# Patient Record
Sex: Female | Born: 1965 | Race: White | Hispanic: No | Marital: Married | Smoking: Current every day smoker
Health system: Southern US, Community
[De-identification: ages and names within clinical notes are randomized; demographics above are authoritative.]

## PROBLEM LIST (undated history)

## (undated) DIAGNOSIS — I639 Cerebral infarction, unspecified: Secondary | ICD-10-CM

## (undated) DIAGNOSIS — I10 Essential (primary) hypertension: Secondary | ICD-10-CM

## (undated) DIAGNOSIS — R7303 Prediabetes: Secondary | ICD-10-CM

## (undated) DIAGNOSIS — C801 Malignant (primary) neoplasm, unspecified: Secondary | ICD-10-CM

## (undated) HISTORY — DX: Prediabetes: R73.03

## (undated) HISTORY — DX: Essential (primary) hypertension: I10

## (undated) HISTORY — DX: Cerebral infarction, unspecified: I63.9

## (undated) HISTORY — PX: TUBAL LIGATION: SHX77

## (undated) HISTORY — PX: BLADDER REPAIR: SHX76

## (undated) HISTORY — PX: ABDOMINAL HYSTERECTOMY: SHX81

## (undated) HISTORY — DX: Malignant (primary) neoplasm, unspecified: C80.1

---

## 1999-06-14 ENCOUNTER — Other Ambulatory Visit: Admission: RE | Admit: 1999-06-14 | Discharge: 1999-06-14 | Payer: Self-pay | Admitting: Orthopedic Surgery

## 1999-10-29 ENCOUNTER — Emergency Department (HOSPITAL_COMMUNITY): Admission: EM | Admit: 1999-10-29 | Discharge: 1999-10-29 | Payer: Self-pay | Admitting: Emergency Medicine

## 2000-01-27 ENCOUNTER — Emergency Department (HOSPITAL_COMMUNITY): Admission: EM | Admit: 2000-01-27 | Discharge: 2000-01-27 | Payer: Self-pay | Admitting: Emergency Medicine

## 2002-03-05 ENCOUNTER — Encounter: Payer: Self-pay | Admitting: Emergency Medicine

## 2002-03-05 ENCOUNTER — Emergency Department (HOSPITAL_COMMUNITY): Admission: EM | Admit: 2002-03-05 | Discharge: 2002-03-05 | Payer: Self-pay | Admitting: Emergency Medicine

## 2002-03-09 ENCOUNTER — Other Ambulatory Visit: Admission: RE | Admit: 2002-03-09 | Discharge: 2002-03-09 | Payer: Self-pay | Admitting: Obstetrics & Gynecology

## 2002-03-09 ENCOUNTER — Emergency Department (HOSPITAL_COMMUNITY): Admission: EM | Admit: 2002-03-09 | Discharge: 2002-03-09 | Payer: Self-pay | Admitting: Emergency Medicine

## 2002-03-24 ENCOUNTER — Emergency Department (HOSPITAL_COMMUNITY): Admission: EM | Admit: 2002-03-24 | Discharge: 2002-03-24 | Payer: Self-pay

## 2004-09-19 ENCOUNTER — Inpatient Hospital Stay (HOSPITAL_COMMUNITY): Admission: AD | Admit: 2004-09-19 | Discharge: 2004-09-19 | Payer: Self-pay | Admitting: Obstetrics

## 2004-09-20 ENCOUNTER — Inpatient Hospital Stay (HOSPITAL_COMMUNITY): Admission: AD | Admit: 2004-09-20 | Discharge: 2004-09-20 | Payer: Self-pay | Admitting: Obstetrics

## 2004-09-21 ENCOUNTER — Inpatient Hospital Stay (HOSPITAL_COMMUNITY): Admission: AD | Admit: 2004-09-21 | Discharge: 2004-09-21 | Payer: Self-pay | Admitting: Obstetrics

## 2004-10-02 ENCOUNTER — Inpatient Hospital Stay (HOSPITAL_COMMUNITY): Admission: EM | Admit: 2004-10-02 | Discharge: 2004-10-05 | Payer: Self-pay | Admitting: Emergency Medicine

## 2004-10-03 ENCOUNTER — Encounter (INDEPENDENT_AMBULATORY_CARE_PROVIDER_SITE_OTHER): Payer: Self-pay | Admitting: *Deleted

## 2004-10-04 ENCOUNTER — Encounter: Payer: Self-pay | Admitting: *Deleted

## 2006-09-15 ENCOUNTER — Inpatient Hospital Stay (HOSPITAL_COMMUNITY): Admission: AD | Admit: 2006-09-15 | Discharge: 2006-09-15 | Payer: Self-pay | Admitting: Obstetrics

## 2007-09-15 ENCOUNTER — Ambulatory Visit (HOSPITAL_COMMUNITY): Admission: RE | Admit: 2007-09-15 | Discharge: 2007-09-15 | Payer: Self-pay | Admitting: Obstetrics & Gynecology

## 2007-09-27 ENCOUNTER — Encounter: Admission: RE | Admit: 2007-09-27 | Discharge: 2007-09-27 | Payer: Self-pay | Admitting: Obstetrics & Gynecology

## 2008-06-30 ENCOUNTER — Encounter: Admission: RE | Admit: 2008-06-30 | Discharge: 2008-06-30 | Payer: Self-pay | Admitting: Obstetrics & Gynecology

## 2009-02-16 ENCOUNTER — Ambulatory Visit: Payer: Self-pay | Admitting: Family Medicine

## 2010-06-11 ENCOUNTER — Encounter: Admission: RE | Admit: 2010-06-11 | Discharge: 2010-06-11 | Payer: Self-pay | Admitting: Obstetrics & Gynecology

## 2010-09-08 ENCOUNTER — Encounter: Payer: Self-pay | Admitting: Obstetrics & Gynecology

## 2011-01-03 NOTE — Discharge Summary (Signed)
Jill Hanna, Jill Hanna                ACCOUNT NO.:  [id number]   MEDICAL RECORD NO.:  [id number]          PATIENT TYPE:  INP   LOCATION:  0366                         FACILITY:  [facility]   PHYSICIAN:  Deirdre Peer. Polite, M.D. DATE OF BIRTH:  August 05, 1966   DATE OF ADMISSION:  10/02/2004  DATE OF DISCHARGE:  10/05/2004                                 DISCHARGE SUMMARY   DISCHARGE DIAGNOSIS:  1.  Chest pain.  2.  Uncontrolled hypertension, controlled at discharge.  3.  Tobacco abuse.  4.  Anxiety.   DISCHARGE MEDICATIONS:  1.  Hydrochlorothiazide 25 mg 1 p.o. daily.  2.  Lopressor 25 mg 1 p.o. b.i.d.   DISPOSITION:  The patient is being discharged to home in stable condition.  She is asked to stop smoking, asked to follow up with primary M.D. for  further monitoring of blood pressure and treatment for anxiety.   STUDIES:  The patient had Cardiolite study which was negative for ischemia,  no wall motion abnormality, normal EF.  The patient had a CT of the head,  within normal limits.  Chest x-ray, no active disease.  MRI was ordered  however the patient refused secondary to not wanting to remove piercing from  her ear.  Carotid Doppler, no stenosis.  BMET within normal limits except  for mild hypokalemia at 3.2.  CBC within normal limits.  Cardiac enzymes  negative for ischemia.  Lipid panel:  Total cholesterol 165, HDL 43, LDL  100.  TSH 0.874.   HISTORY OF PRESENT ILLNESS:  A 45 year old female presented to the ED with  complaints of left sided chest pain and arm numbness.  Admission was deemed  necessary for further evaluation and treatment.  Please see admission H&P  for further details.   PAST MEDICAL HISTORY:  As stated above.   MEDICATIONS ON ADMISSION:  Maxide 7.5/25 daily and ibuprofen p.r.n.Marland Kitchen   ALLERGIES:  IVP DYE, PENICILLIN.   SOCIAL HISTORY:  Positive for tobacco 1/2 pack per day, occasional alcohol,  no drugs.   FAMILY HISTORY:  Mother with hypertension and  diabetes.   HOSPITAL COURSE:  The patient was admitted to a telemetry bed for evaluation  and treatment of hypertension emergency and chest pain.  The patient had  several studies as stated in data section.  At the time of my review the  patient was without chest pain, no left arm numbness.  At my review the day  before, the patient's symptoms were of more concern for angina than CVA,  although studies were ordered to rule out both.  The patient also refused  MRI as she did not want to remove her piercings.  However her carotid  Dopplers were within normal limits and CT of the head was negative.  As far  as evaluation for cardiac disease, the patient had cardiac enzymes which  were negative for ischemia.  She also had a Cardiolite, EF within normal  limits and again negative for ischemia.  The patient's  blood pressure was well controlled throughout this hospitalization on two  medications suggesting possible noncompliance on an outpatient  basis.  At  this time, the patient is medically stable for discharge, continue as  medications as outlined.      RDP/MEDQ  D:  10/05/2004  T:  10/06/2004  Job:  [id number]

## 2012-10-28 ENCOUNTER — Other Ambulatory Visit: Payer: Self-pay

## 2012-10-28 DIAGNOSIS — Z1231 Encounter for screening mammogram for malignant neoplasm of breast: Secondary | ICD-10-CM

## 2012-11-02 ENCOUNTER — Ambulatory Visit: Payer: Self-pay

## 2012-11-17 ENCOUNTER — Ambulatory Visit
Admission: RE | Admit: 2012-11-17 | Discharge: 2012-11-17 | Disposition: A | Discharge status: Home / Self Care | Payer: 59 | Source: Ambulatory Visit

## 2012-11-17 DIAGNOSIS — Z1231 Encounter for screening mammogram for malignant neoplasm of breast: Secondary | ICD-10-CM

## 2012-11-18 ENCOUNTER — Encounter: Payer: Self-pay | Admitting: Obstetrics & Gynecology

## 2012-12-13 ENCOUNTER — Ambulatory Visit: Payer: Self-pay | Admitting: Obstetrics & Gynecology

## 2012-12-15 ENCOUNTER — Ambulatory Visit: Payer: Self-pay | Admitting: Obstetrics & Gynecology

## 2013-05-20 ENCOUNTER — Encounter: Payer: Self-pay | Admitting: Advanced Practice Midwife

## 2013-05-20 ENCOUNTER — Ambulatory Visit (INDEPENDENT_AMBULATORY_CARE_PROVIDER_SITE_OTHER): Payer: 59 | Admitting: Advanced Practice Midwife

## 2013-05-20 VITALS — BP 136/78 | HR 91 | Temp 98.3°F | Ht 64.0 in | Wt 155.2 lb

## 2013-05-20 DIAGNOSIS — R1904 Left lower quadrant abdominal swelling, mass and lump: Secondary | ICD-10-CM

## 2013-05-20 DIAGNOSIS — R6889 Other general symptoms and signs: Secondary | ICD-10-CM

## 2013-05-20 DIAGNOSIS — R102 Pelvic and perineal pain: Secondary | ICD-10-CM

## 2013-05-20 DIAGNOSIS — F17200 Nicotine dependence, unspecified, uncomplicated: Secondary | ICD-10-CM

## 2013-05-20 DIAGNOSIS — IMO0002 Reserved for concepts with insufficient information to code with codable children: Secondary | ICD-10-CM

## 2013-05-20 DIAGNOSIS — R300 Dysuria: Secondary | ICD-10-CM | POA: Insufficient documentation

## 2013-05-20 DIAGNOSIS — Z01419 Encounter for gynecological examination (general) (routine) without abnormal findings: Secondary | ICD-10-CM

## 2013-05-20 LAB — POCT URINALYSIS DIPSTICK: Spec Grav, UA: 1.015

## 2013-05-20 NOTE — Progress Notes (Signed)
.   Subjective:     Jill Hanna is a 47 y.o. female here for a problem exam.  Current complaints: Patient states that she is having a lot of pelvic pain for the last 2 -3 weeks.  She also thinks she has a UTI.  Personal health questionnaire reviewed: yes.  Patient reports pain w/ intercourse and penetration. Denies vaginal dryness. Reports pain is in the pelvic abdominal region and can vary in degree of severity, occasional short stabbing pain on left side. Has had tubal ligation. Having a MP monthly, very heavy and painful, this has been her normal for some time.  Patient reports an URI.   She has HTN managed by her PCP.   Denies N/V/D.    Gynecologic History Patient's last menstrual period was 04/29/2013. Contraception: none Last Pap: 2011. Results were: normal Last mammogram: 2014. Results were: normal  Obstetric History OB History  No data available     The following portions of the patient's history were reviewed and updated as appropriate: allergies, current medications, past family history, past medical history, past social history, past surgical history and problem list.  Review of Systems A comprehensive review of systems was negative.    Objective:    BP 136/78  Pulse 91  Temp(Src) 98.3 F (36.8 C) (Oral)  Ht 5\' 4"  (1.626 m)  Wt 155 lb 3.2 oz (70.398 kg)  BMI 26.63 kg/m2  LMP 04/29/2013  General Appearance:    Alert, cooperative, no distress, appears stated age                 Physical Examination: Pelvic - VULVA: normal appearing vulva with no masses, tenderness or lesions, VAGINA: normal appearing vagina with normal color and discharge, no lesions, atrophic, CERVIX: normal appearing cervix without discharge or lesions, UTERUS: tenderness 5/10, Unable to rule out mass, fullness on left lower quadrant, ADNEXA: tenderness bilateral, unable to rule out mass  Urine dipstick shows negative for all components.  Micro exam: not done.   Assessment:   Patient  Active Problem List   Diagnosis Date Noted  . Pelvic pain 05/20/2013  . Hx of Abnormal Pap smear 05/20/2013  . Dysuria 05/20/2013  . Smoker 05/20/2013      Plan:    Patient to have pelvic US then f/u w/ MD Tamela Oddi for consultation and management.    Encouraged patient to stop smoking. Patient to f/u w/ PCP for management of URI due to high blood pressure and smoking. They may want a chest x-ray, etc.  Urine culture sent, UA negative today. Reviewed comfort measures for dysuria.  Patient was due for her pap today.  Patient up to date on her mammogram.  STI screening per patient request.  Dory Horn CNM 35 min spent with patient greater than 80% spent in counseling and coordination of care.

## 2013-05-21 LAB — URINE CULTURE: Organism ID, Bacteria: NO GROWTH

## 2013-05-23 LAB — PAP IG, CT-NG, RFX HPV ASCU

## 2013-05-25 ENCOUNTER — Other Ambulatory Visit: Payer: Self-pay | Admitting: Advanced Practice Midwife

## 2013-05-25 DIAGNOSIS — R141 Gas pain: Secondary | ICD-10-CM

## 2013-05-25 DIAGNOSIS — R1032 Left lower quadrant pain: Secondary | ICD-10-CM

## 2013-05-31 ENCOUNTER — Encounter: Payer: Self-pay | Admitting: Obstetrics & Gynecology

## 2013-05-31 ENCOUNTER — Ambulatory Visit (INDEPENDENT_AMBULATORY_CARE_PROVIDER_SITE_OTHER): Payer: 59

## 2013-05-31 DIAGNOSIS — Z01419 Encounter for gynecological examination (general) (routine) without abnormal findings: Secondary | ICD-10-CM

## 2013-05-31 DIAGNOSIS — R1904 Left lower quadrant abdominal swelling, mass and lump: Secondary | ICD-10-CM

## 2013-05-31 DIAGNOSIS — R141 Gas pain: Secondary | ICD-10-CM

## 2013-05-31 DIAGNOSIS — R102 Pelvic and perineal pain unspecified side: Secondary | ICD-10-CM

## 2013-05-31 DIAGNOSIS — R1032 Left lower quadrant pain: Secondary | ICD-10-CM

## 2013-06-09 ENCOUNTER — Ambulatory Visit: Payer: 59 | Admitting: Obstetrics & Gynecology

## 2013-07-04 ENCOUNTER — Encounter: Payer: Self-pay | Admitting: Obstetrics & Gynecology

## 2014-04-11 ENCOUNTER — Other Ambulatory Visit: Payer: Self-pay

## 2014-04-11 DIAGNOSIS — Z1231 Encounter for screening mammogram for malignant neoplasm of breast: Secondary | ICD-10-CM

## 2014-05-15 ENCOUNTER — Ambulatory Visit
Admission: RE | Admit: 2014-05-15 | Discharge: 2014-05-15 | Disposition: A | Discharge status: Home / Self Care | Payer: 59 | Source: Ambulatory Visit

## 2014-05-15 DIAGNOSIS — Z1231 Encounter for screening mammogram for malignant neoplasm of breast: Secondary | ICD-10-CM

## 2014-05-17 ENCOUNTER — Other Ambulatory Visit: Payer: Self-pay | Admitting: Obstetrics & Gynecology

## 2014-05-17 DIAGNOSIS — R928 Other abnormal and inconclusive findings on diagnostic imaging of breast: Secondary | ICD-10-CM

## 2014-05-25 ENCOUNTER — Ambulatory Visit
Admission: RE | Admit: 2014-05-25 | Discharge: 2014-05-25 | Disposition: A | Discharge status: Home / Self Care | Payer: 59 | Source: Ambulatory Visit | Attending: Obstetrics & Gynecology | Admitting: Obstetrics & Gynecology

## 2014-05-25 DIAGNOSIS — R928 Other abnormal and inconclusive findings on diagnostic imaging of breast: Secondary | ICD-10-CM

## 2014-08-03 ENCOUNTER — Encounter: Payer: Self-pay | Admitting: Obstetrics & Gynecology

## 2014-08-03 ENCOUNTER — Ambulatory Visit (INDEPENDENT_AMBULATORY_CARE_PROVIDER_SITE_OTHER): Payer: 59 | Admitting: Obstetrics & Gynecology

## 2014-08-03 ENCOUNTER — Ambulatory Visit: Payer: 59 | Admitting: Obstetrics & Gynecology

## 2014-08-03 VITALS — BP 167/102 | HR 91 | Temp 96.8°F | Ht 64.0 in | Wt 152.0 lb

## 2014-08-03 DIAGNOSIS — Z01419 Encounter for gynecological examination (general) (routine) without abnormal findings: Secondary | ICD-10-CM

## 2014-08-03 DIAGNOSIS — J069 Acute upper respiratory infection, unspecified: Secondary | ICD-10-CM

## 2014-08-03 LAB — CBC WITH DIFFERENTIAL/PLATELET
BASOS ABS: 0.1 10*3/uL (ref 0.0–0.1)
BASOS PCT: 1 % (ref 0–1)
Eosinophils Absolute: 0.2 10*3/uL (ref 0.0–0.7)
Eosinophils Relative: 2 % (ref 0–5)
HCT: 38.1 % (ref 36.0–46.0)
Hemoglobin: 12.1 g/dL (ref 12.0–15.0)
LYMPHS PCT: 21 % (ref 12–46)
Lymphs Abs: 2 10*3/uL (ref 0.7–4.0)
MCH: 24.6 pg — ABNORMAL LOW (ref 26.0–34.0)
MCHC: 31.8 g/dL (ref 30.0–36.0)
MCV: 77.4 fL — AB (ref 78.0–100.0)
MONO ABS: 0.8 10*3/uL (ref 0.1–1.0)
MPV: 9.1 fL — AB (ref 9.4–12.4)
Monocytes Relative: 8 % (ref 3–12)
NEUTROS ABS: 6.6 10*3/uL (ref 1.7–7.7)
NEUTROS PCT: 68 % (ref 43–77)
PLATELETS: 347 10*3/uL (ref 150–400)
RBC: 4.92 MIL/uL (ref 3.87–5.11)
RDW: 16.7 % — AB (ref 11.5–15.5)
WBC: 9.7 10*3/uL (ref 4.0–10.5)

## 2014-08-03 MED ORDER — AZITHROMYCIN 250 MG PO TABS: ORAL_TABLET | ORAL | Status: DC
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
## 2014-08-03 NOTE — Patient Instructions (Signed)

## 2014-08-04 LAB — TSH: TSH: 1.121 u[IU]/mL (ref 0.350–4.500)

## 2014-08-04 LAB — PAP IG (IMAGE GUIDED)

## 2014-08-14 ENCOUNTER — Encounter: Payer: Self-pay | Admitting: *Deleted

## 2014-08-15 ENCOUNTER — Telehealth: Payer: Self-pay | Admitting: *Deleted

## 2014-08-15 ENCOUNTER — Encounter: Payer: Self-pay | Admitting: Obstetrics & Gynecology

## 2014-08-15 NOTE — Telephone Encounter (Signed)
Pt called to office requesting lab results.   Return call to pt.  Made pt aware that all labs were WNL.  Pt asked about what could be causing her hair loss.  Pt would like to know if we could possible check her estrogen level or any other hormones that may cause this.  Pt states that she may also see a dermatologist for this as well. Pt made aware that lab request would be forwarded to Dr Delsa Sale for recommendation.   Please advise on any additional labs for hair loss.

## 2014-08-21 NOTE — Telephone Encounter (Signed)
If you are able to get a hold of Shaconda please tell her about [facility] [place], there is a Solicitor and [place] location and they should be able to help her.

## 2014-08-28 NOTE — Telephone Encounter (Signed)
I think the pt has seen a dermatologist.  Doubt it is related to ERT

## 2014-09-01 NOTE — Telephone Encounter (Signed)
Placed call to follow up with pt regarding hair loss.  Pt made aware of recommendation for dermatologist.  Pt states that she has not been able to make appt due to cost.  Pt advised to contact office if she has any further concerns.

## 2014-09-29 ENCOUNTER — Encounter: Payer: Self-pay | Admitting: Obstetrics

## 2014-09-29 ENCOUNTER — Ambulatory Visit (INDEPENDENT_AMBULATORY_CARE_PROVIDER_SITE_OTHER): Payer: 59 | Admitting: Obstetrics

## 2014-09-29 VITALS — BP 153/89 | HR 81 | Temp 97.7°F | Wt 156.0 lb

## 2014-09-29 DIAGNOSIS — D251 Intramural leiomyoma of uterus: Secondary | ICD-10-CM

## 2014-09-29 DIAGNOSIS — N946 Dysmenorrhea, unspecified: Secondary | ICD-10-CM

## 2014-09-29 DIAGNOSIS — N939 Abnormal uterine and vaginal bleeding, unspecified: Secondary | ICD-10-CM

## 2014-09-29 LAB — CBC WITH DIFFERENTIAL/PLATELET
BASOS ABS: 0 10*3/uL (ref 0.0–0.1)
BASOS PCT: 0 % (ref 0–1)
EOS ABS: 0.2 10*3/uL (ref 0.0–0.7)
Eosinophils Relative: 3 % (ref 0–5)
HEMATOCRIT: 36 % (ref 36.0–46.0)
Hemoglobin: 11.4 g/dL — ABNORMAL LOW (ref 12.0–15.0)
Lymphocytes Relative: 25 % (ref 12–46)
Lymphs Abs: 1.7 10*3/uL (ref 0.7–4.0)
MCH: 24.9 pg — ABNORMAL LOW (ref 26.0–34.0)
MCHC: 31.7 g/dL (ref 30.0–36.0)
MCV: 78.8 fL (ref 78.0–100.0)
MONOS PCT: 10 % (ref 3–12)
MPV: 9.4 fL (ref 8.6–12.4)
Monocytes Absolute: 0.7 10*3/uL (ref 0.1–1.0)
NEUTROS ABS: 4.2 10*3/uL (ref 1.7–7.7)
NEUTROS PCT: 62 % (ref 43–77)
Platelets: 276 10*3/uL (ref 150–400)
RBC: 4.57 MIL/uL (ref 3.87–5.11)
RDW: 19.2 % — AB (ref 11.5–15.5)
WBC: 6.7 10*3/uL (ref 4.0–10.5)

## 2014-09-29 LAB — POCT URINALYSIS DIPSTICK
Bilirubin, UA: NEGATIVE
Blood, UA: 250
GLUCOSE UA: NEGATIVE
KETONES UA: NEGATIVE
LEUKOCYTES UA: NEGATIVE
Nitrite, UA: NEGATIVE
PROTEIN UA: NEGATIVE
SPEC GRAV UA: 1.02
UROBILINOGEN UA: NEGATIVE
pH, UA: 5

## 2014-09-29 MED ORDER — IBUPROFEN 800 MG PO TABS: 800.0000 mg | ORAL_TABLET | Freq: Three times a day (TID) | ORAL | Status: DC | PRN

## 2014-09-29 MED ORDER — OXYCODONE HCL 10 MG PO TABS: 10.0000 mg | ORAL_TABLET | Freq: Four times a day (QID) | ORAL | Status: DC | PRN

## 2014-09-29 NOTE — Addendum Note (Signed)
Addended by: Lewie Loron D on: 09/29/2014 04:00 PM   Modules accepted: Orders
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
## 2014-10-04 ENCOUNTER — Other Ambulatory Visit: Payer: 59

## 2014-10-04 ENCOUNTER — Ambulatory Visit (INDEPENDENT_AMBULATORY_CARE_PROVIDER_SITE_OTHER): Payer: 59

## 2014-10-04 ENCOUNTER — Other Ambulatory Visit: Payer: Self-pay | Admitting: Obstetrics

## 2014-10-04 DIAGNOSIS — N939 Abnormal uterine and vaginal bleeding, unspecified: Secondary | ICD-10-CM

## 2014-10-04 DIAGNOSIS — N946 Dysmenorrhea, unspecified: Secondary | ICD-10-CM

## 2014-10-04 DIAGNOSIS — D251 Intramural leiomyoma of uterus: Secondary | ICD-10-CM

## 2014-10-10 ENCOUNTER — Other Ambulatory Visit: Payer: 59

## 2014-10-18 ENCOUNTER — Ambulatory Visit: Payer: 59 | Admitting: Certified Nurse Midwife

## 2015-02-02 ENCOUNTER — Emergency Department (HOSPITAL_COMMUNITY)
Admission: EM | Admit: 2015-02-02 | Discharge: 2015-02-02 | Disposition: A | Discharge status: Nursing Home (Includes ICF) | Payer: Managed Care, Other (non HMO) | Source: Home / Self Care | Attending: Family Medicine | Admitting: Family Medicine

## 2015-02-02 ENCOUNTER — Encounter (HOSPITAL_COMMUNITY): Payer: Self-pay | Admitting: Emergency Medicine

## 2015-02-02 ENCOUNTER — Emergency Department (HOSPITAL_COMMUNITY)
Admission: EM | Admit: 2015-02-02 | Discharge: 2015-02-03 | Disposition: A | Discharge status: Home / Self Care | Payer: Managed Care, Other (non HMO) | Attending: Emergency Medicine | Admitting: Emergency Medicine

## 2015-02-02 ENCOUNTER — Encounter (HOSPITAL_COMMUNITY): Payer: Self-pay | Admitting: *Deleted

## 2015-02-02 ENCOUNTER — Emergency Department (HOSPITAL_COMMUNITY): Payer: Managed Care, Other (non HMO)

## 2015-02-02 DIAGNOSIS — Y998 Other external cause status: Secondary | ICD-10-CM | POA: Insufficient documentation

## 2015-02-02 DIAGNOSIS — S81012A Laceration without foreign body, left knee, initial encounter: Secondary | ICD-10-CM

## 2015-02-02 DIAGNOSIS — S99911A Unspecified injury of right ankle, initial encounter: Secondary | ICD-10-CM | POA: Diagnosis not present

## 2015-02-02 DIAGNOSIS — I10 Essential (primary) hypertension: Secondary | ICD-10-CM | POA: Diagnosis not present

## 2015-02-02 DIAGNOSIS — Z880 Allergy status to penicillin: Secondary | ICD-10-CM | POA: Diagnosis not present

## 2015-02-02 DIAGNOSIS — Z720 Tobacco use: Secondary | ICD-10-CM | POA: Insufficient documentation

## 2015-02-02 DIAGNOSIS — W01198A Fall on same level from slipping, tripping and stumbling with subsequent striking against other object, initial encounter: Secondary | ICD-10-CM | POA: Insufficient documentation

## 2015-02-02 DIAGNOSIS — Y92009 Unspecified place in unspecified non-institutional (private) residence as the place of occurrence of the external cause: Secondary | ICD-10-CM | POA: Diagnosis not present

## 2015-02-02 DIAGNOSIS — Z79899 Other long term (current) drug therapy: Secondary | ICD-10-CM | POA: Insufficient documentation

## 2015-02-02 DIAGNOSIS — Y9389 Activity, other specified: Secondary | ICD-10-CM | POA: Diagnosis not present

## 2015-02-02 DIAGNOSIS — IMO0002 Reserved for concepts with insufficient information to code with codable children: Secondary | ICD-10-CM

## 2015-02-02 MED ORDER — CEPHALEXIN 500 MG PO CAPS: 500.0000 mg | ORAL_CAPSULE | Freq: Four times a day (QID) | ORAL | Status: DC

## 2015-02-02 MED ORDER — LIDOCAINE-EPINEPHRINE (PF) 2 %-1:200000 IJ SOLN
10.0000 mL | Freq: Once | INTRAMUSCULAR | Status: AC
  Administered 2015-02-02: 10 mL
  Filled 2015-02-02: qty 20

## 2015-02-02 MED ORDER — TETANUS-DIPHTH-ACELL PERTUSSIS 5-2.5-18.5 LF-MCG/0.5 IM SUSP
INTRAMUSCULAR | Status: AC
  Filled 2015-02-02: qty 0.5

## 2015-02-02 MED ORDER — BACITRACIN ZINC 500 UNIT/GM EX OINT
1.0000 "application " | TOPICAL_OINTMENT | Freq: Two times a day (BID) | CUTANEOUS | Status: DC
  Administered 2015-02-03: 1 via TOPICAL
  Filled 2015-02-02: qty 28.35
  Filled 2015-02-02: qty 0.9

## 2015-02-02 MED ORDER — OXYCODONE-ACETAMINOPHEN 5-325 MG PO TABS
1.0000 | ORAL_TABLET | Freq: Once | ORAL | Status: AC
  Administered 2015-02-02: 1 via ORAL
  Filled 2015-02-02: qty 1

## 2015-02-02 MED ORDER — TETANUS-DIPHTH-ACELL PERTUSSIS 5-2.5-18.5 LF-MCG/0.5 IM SUSP
0.5000 mL | Freq: Once | INTRAMUSCULAR | Status: AC
  Administered 2015-02-02: 0.5 mL via INTRAMUSCULAR

## 2015-02-02 NOTE — ED Provider Notes (Signed)
CSN: [id number]     Arrival date & time 02/02/15  2040 History   This chart was scribed for Comer Locket, PA-C working with Elnora Morrison, MD by Mercy Moore, ED Scribe. This patient was seen in room TR01C/TR01C and the patient's care was started at 9:56 PM.   Chief Complaint  Patient presents with  . Laceration  . Fall   The history is provided by the patient.   HPI Comments: Jill Hanna is a 49 y.o. female with PMHx of Hypertension who presents to the Emergency Department as transfer from [facility] for evaluation of left knee laceration incurred just 24 hours ago. Patient sustained deep laceration to anterior patella and abrasions to lower leg after tripping over pet cat and striking knee on concrete step. Patient denies head injury or loss of consciousness.  Patient reports that she attempted to contact orthopedist, but the office is closed today. Patient reports treatment with Neosporin and bandaging. Patient reports severe pain and swelling left knee and right ankle swelling. Patient reports decreased flexion and extension due to pain severity. Patient with history of Hypertension; not on anticoagulants.   Past Medical History  Diagnosis Date  . Hypertension    Past Surgical History  Procedure Laterality Date  . Tubal ligation     Family History  Problem Relation Age of Onset  . Cancer Maternal Grandmother   . Cancer Mother    History  Substance Use Topics  . Smoking status: Current Every Day Smoker -- 0.00 packs/day for 0 years  . Smokeless tobacco: Never Used  . Alcohol Use: Yes   OB History    Gravida Para Term Preterm AB TAB SAB Ectopic Multiple Living   2 2 1 1  0 0 0 0 0 3     Review of Systems  Constitutional: Negative for fever and chills.  HENT: Negative for congestion, rhinorrhea and sore throat.   Eyes: Negative for visual disturbance.  Respiratory: Negative for cough, shortness of breath and wheezing.   Gastrointestinal: Negative for nausea,  vomiting, abdominal pain and diarrhea.  Endocrine: Negative for polyuria.  Genitourinary: Negative for dysuria and hematuria.  Musculoskeletal: Positive for arthralgias. Negative for back pain and joint swelling.  Skin: Positive for wound. Negative for rash.  Allergic/Immunologic: Negative for immunocompromised state.  Neurological: Negative for syncope and headaches.  Hematological: Does not bruise/bleed easily.  Psychiatric/Behavioral: Negative for confusion.  All other systems reviewed and are negative.     Allergies  Penicillins  Home Medications   Prior to Admission medications   Medication Sig Start Date End Date Taking? Authorizing Provider  amLODipine-valsartan (EXFORGE) 5-160 MG per tablet Take 1 tablet by mouth daily.    Historical Provider, MD  azithromycin (ZITHROMAX Z-PAK) 250 MG tablet Take as directed. Patient not taking: Reported on 09/29/2014 08/03/14   Lahoma Crocker, MD  cephALEXin (KEFLEX) 500 MG capsule Take 1 capsule (500 mg total) by mouth 4 (four) times daily. 02/02/15   Comer Locket, PA-C  ibuprofen (ADVIL,MOTRIN) 800 MG tablet Take 1 tablet (800 mg total) by mouth every 8 (eight) hours as needed. 09/29/14   Shelly Bombard, MD  Oxycodone HCl 10 MG TABS Take 1 tablet (10 mg total) by mouth every 6 (six) hours as needed. 09/29/14   Shelly Bombard, MD   Triage Vitals: BP 158/80 mmHg  Pulse 88  Temp(Src) 98.2 F (36.8 C) (Oral)  Resp 18  SpO2 99%  LMP 01/17/2015 Physical Exam  Constitutional: She is oriented to  person, place, and time. She appears well-developed and well-nourished. No distress.  HENT:  Head: Normocephalic and atraumatic.  Eyes: EOM are normal.  Neck: Neck supple. No tracheal deviation present.  Cardiovascular: Normal rate and regular rhythm.   Pulmonary/Chest: Effort normal. No respiratory distress.  Abdominal: Soft. There is no tenderness.  Musculoskeletal: Normal range of motion.  Left knee: approximately 4cm transverse  laceration noted to lateral aspect of knee. Approximately .5 cm deep in the middle of laceration. Base of the wound is visualized and there does not appear to be any joint space involvement. Hemostasis achieved.  ROM of left knee limited secondary to pain.  Full ROM of left ankle. Oblique abrasion noted to proxmial tibia next to patella tendon.  Distal pulses intact. NVI.   Neurological: She is alert and oriented to person, place, and time.  Skin: Skin is warm and dry.  Psychiatric: She has a normal mood and affect. Her behavior is normal.  Nursing note and vitals reviewed.   ED Course  Procedures (including critical care time)  COORDINATION OF CARE: 10:12 PM- Discussed treatment plan with patient at bedside and patient agreed to plan.   Labs Review Labs Reviewed - No data to display  Imaging Review Dg Knee Complete 4 Views Left  02/02/2015   CLINICAL DATA:  Laceration to the anterior left knee and pain after a fall last night on a wooden deck.  EXAM: LEFT KNEE - COMPLETE 4+ VIEW  COMPARISON:  None.  FINDINGS: There is no evidence of fracture, dislocation, or joint effusion. There is no evidence of arthropathy or other focal bone abnormality. Soft tissues are unremarkable.  IMPRESSION: Negative.   Electronically Signed   By: Lucienne Capers M.D.   On: 02/02/2015 21:26     EKG Interpretation None     Meds given in ED:  Medications  bacitracin ointment 1 application (not administered)  lidocaine-EPINEPHrine (XYLOCAINE W/EPI) 2 %-1:200000 (PF) injection 10 mL (10 mLs Infiltration Given 02/02/15 2300)  oxyCODONE-acetaminophen (PERCOCET/ROXICET) 5-325 MG per tablet 1 tablet (1 tablet Oral Given 02/02/15 2300)    New Prescriptions   CEPHALEXIN (KEFLEX) 500 MG CAPSULE    Take 1 capsule (500 mg total) by mouth 4 (four) times daily.   Filed Vitals:   02/02/15 2103  BP: 158/80  Pulse: 88  Temp: 98.2 F (36.8 C)  TempSrc: Oral  Resp: 18  SpO2: 99%   LACERATION REPAIR Performed  by: Verl Dicker Authorized by: Verl Dicker Consent: Verbal consent obtained. Risks and benefits: risks, benefits and alternatives were discussed Consent given by: patient Patient identity confirmed: provided demographic data Prepped and Draped in normal sterile fashion Wound explored  Laceration Location: left knee  Laceration Length 4cm  No Foreign Bodies seen or palpated  Anesthesia: local infiltration  Local anesthetic: lidocaine2 % without epinephrine  Anesthetic total: 5 ml  Irrigation method: syringe Amount of cleaning: standard  Skin closure: 3-0 prolene Loose closure  Number of sutures: 3  Technique: Simple int. Patient tolerance: Patient tolerated the procedure well with no immediate complications. MDM  Vitals stable - WNL -afebrile Pt resting comfortably in ED. PE--laceration over left patella no evidence of joint space involvement. Patient remains neurovascularly intact Imaging--plain films of left knee negative for acute osseous abnormalities.  DDX-- laceration repaired by myself at bedside. Topical antibiotic and dressing applied. Patient given oral antibiotic and instructions to follow-up with her primary care in 2 days. Patient agrees to plan.  I discussed all relevant lab findings  and imaging results with pt and they verbalized understanding. Discussed f/u with PCP within 48 hrs and return precautions, pt very amenable to plan. Prior to patient discharge, I discussed and reviewed this case with Dr. Reather Converse, who also saw and evaluated the patient   Final diagnoses:  Laceration    I personally performed the services described in this documentation, which was scribed in my presence. The recorded information has been reviewed and is accurate.    Comer Locket, PA-C 02/02/15 [id number]  Elnora Morrison, MD 02/03/15 [phone]

## 2015-02-02 NOTE — ED Provider Notes (Signed)
CSN: [id number]     Arrival date & time 02/02/15  1848 History   First MD Initiated Contact with Patient 02/02/15 1959     Chief Complaint  Patient presents with  . Fall   (Consider location/radiation/quality/duration/timing/severity/associated sxs/prior Treatment) Patient is a 49 y.o. female presenting with fall. The history is provided by the patient.  Fall This is a new problem. The current episode started 12 to 24 hours ago (tripped over pet last eve on brick steps stiking left knee, sustaining lac and abrasions and pain). The problem has been gradually worsening. Pertinent negatives include no chest pain, no abdominal pain and no shortness of breath.    Past Medical History  Diagnosis Date  . Hypertension    Past Surgical History  Procedure Laterality Date  . Tubal ligation     Family History  Problem Relation Age of Onset  . Cancer Maternal Grandmother   . Cancer Mother    History  Substance Use Topics  . Smoking status: Current Every Day Smoker -- 0.50 packs/day for 25 years  . Smokeless tobacco: Never Used  . Alcohol Use: 1.8 - 2.4 oz/week    3-4 Shots of liquor per week     Comment: Occ.   OB History    Gravida Para Term Preterm AB TAB SAB Ectopic Multiple Living   2 2 1 1  0 0 0 0 0 3     Review of Systems  Constitutional: Positive for activity change.  Respiratory: Negative for shortness of breath.   Cardiovascular: Negative for chest pain.  Gastrointestinal: Negative.  Negative for abdominal pain.  Musculoskeletal: Positive for joint swelling and gait problem. Negative for back pain.  Skin: Positive for wound.    Allergies  Penicillins  Home Medications   Prior to Admission medications   Medication Sig Start Date End Date Taking? Authorizing Provider  amLODipine-valsartan (EXFORGE) 5-160 MG per tablet Take 1 tablet by mouth daily.    Historical Provider, MD  azithromycin (ZITHROMAX Z-PAK) 250 MG tablet Take as directed. Patient not taking: Reported  on 09/29/2014 08/03/14   Lahoma Crocker, MD  ibuprofen (ADVIL,MOTRIN) 800 MG tablet Take 1 tablet (800 mg total) by mouth every 8 (eight) hours as needed. 09/29/14   Shelly Bombard, MD  Oxycodone HCl 10 MG TABS Take 1 tablet (10 mg total) by mouth every 6 (six) hours as needed. 09/29/14   Shelly Bombard, MD   BP 168/86 mmHg  Pulse 80  Temp(Src) 98.4 F (36.9 C) (Oral)  Resp 16  SpO2 98%  LMP 01/17/2015 Physical Exam  Constitutional: She is oriented to person, place, and time. She appears well-developed and well-nourished. She appears distressed.  Abdominal: Soft. Bowel sounds are normal.  Musculoskeletal: She exhibits tenderness.       Left knee: She exhibits decreased range of motion, swelling, effusion, laceration and bony tenderness.       Legs: Neurological: She is alert and oriented to person, place, and time.  Skin: Skin is warm and dry. Rash noted.  Left lower leg abrasions with assoc deep 3cm lac over patella  Nursing note and vitals reviewed.   ED Course  Procedures (including critical care time) Labs Review Labs Reviewed - No data to display  Imaging Review No results found.   MDM   1. Knee laceration, left, initial encounter        Billy Fischer, MD 02/02/15 2006

## 2015-02-02 NOTE — Discharge Instructions (Signed)
Follow-up with your primary care in 2 days for reevaluation. Take your antibiotic as prescribed. You may take showers, but avoid taking baths. Return to ED for new or worsening symptoms.  Laceration Care, Adult A laceration is a cut or lesion that goes through all layers of the skin and into the tissue just beneath the skin. TREATMENT  Some lacerations may not require closure. Some lacerations may not be able to be closed due to an increased risk of infection. It is important to see your caregiver as soon as possible after an injury to minimize the risk of infection and maximize the opportunity for successful closure. If closure is appropriate, pain medicines may be given, if needed. The wound will be cleaned to help prevent infection. Your caregiver will use stitches (sutures), staples, wound glue (adhesive), or skin adhesive strips to repair the laceration. These tools bring the skin edges together to allow for faster healing and a better cosmetic outcome. However, all wounds will heal with a scar. Once the wound has healed, scarring can be minimized by covering the wound with sunscreen during the day for 1 full year. HOME CARE INSTRUCTIONS  For sutures or staples:  Keep the wound clean and dry.  If you were given a bandage (dressing), you should change it at least once a day. Also, change the dressing if it becomes wet or dirty, or as directed by your caregiver.  Wash the wound with soap and water 2 times a day. Rinse the wound off with water to remove all soap. Pat the wound dry with a clean towel.  After cleaning, apply a thin layer of the antibiotic ointment as recommended by your caregiver. This will help prevent infection and keep the dressing from sticking.  You may shower as usual after the first 24 hours. Do not soak the wound in water until the sutures are removed.  Only take over-the-counter or prescription medicines for pain, discomfort, or fever as directed by your  caregiver.  Get your sutures or staples removed as directed by your caregiver. For skin adhesive strips:  Keep the wound clean and dry.  Do not get the skin adhesive strips wet. You may bathe carefully, using caution to keep the wound dry.  If the wound gets wet, pat it dry with a clean towel.  Skin adhesive strips will fall off on their own. You may trim the strips as the wound heals. Do not remove skin adhesive strips that are still stuck to the wound. They will fall off in time. For wound adhesive:  You may briefly wet your wound in the shower or bath. Do not soak or scrub the wound. Do not swim. Avoid periods of heavy perspiration until the skin adhesive has fallen off on its own. After showering or bathing, gently pat the wound dry with a clean towel.  Do not apply liquid medicine, cream medicine, or ointment medicine to your wound while the skin adhesive is in place. This may loosen the film before your wound is healed.  If a dressing is placed over the wound, be careful not to apply tape directly over the skin adhesive. This may cause the adhesive to be pulled off before the wound is healed.  Avoid prolonged exposure to sunlight or tanning lamps while the skin adhesive is in place. Exposure to ultraviolet light in the first year will darken the scar.  The skin adhesive will usually remain in place for 5 to 10 days, then naturally fall off the skin.  Do not pick at the adhesive film. You may need a tetanus shot if:  You cannot remember when you had your last tetanus shot.  You have never had a tetanus shot. If you get a tetanus shot, your arm may swell, get red, and feel warm to the touch. This is common and not a problem. If you need a tetanus shot and you choose not to have one, there is a rare chance of getting tetanus. Sickness from tetanus can be serious. SEEK MEDICAL CARE IF:   You have redness, swelling, or increasing pain in the wound.  You see a red line that goes away  from the wound.  You have yellowish-white fluid (pus) coming from the wound.  You have a fever.  You notice a bad smell coming from the wound or dressing.  Your wound breaks open before or after sutures have been removed.  You notice something coming out of the wound such as wood or glass.  Your wound is on your hand or foot and you cannot move a finger or toe. SEEK IMMEDIATE MEDICAL CARE IF:   Your pain is not controlled with prescribed medicine.  You have severe swelling around the wound causing pain and numbness or a change in color in your arm, hand, leg, or foot.  Your wound splits open and starts bleeding.  You have worsening numbness, weakness, or loss of function of any joint around or beyond the wound.  You develop painful lumps near the wound or on the skin anywhere on your body. MAKE SURE YOU:   Understand these instructions.  Will watch your condition.  Will get help right away if you are not doing well or get worse. Document Released: 08/04/2005 Document Revised: 10/27/2011 Document Reviewed: 01/28/2011 [facility] Patient Information 2015 [place], [place]. This information is not intended to replace advice given to you by your health care provider. Make sure you discuss any questions you have with your health care provider.

## 2015-02-02 NOTE — ED Notes (Signed)
Fell  Last  Pm      inj  To  l  Knee   And  l   Leg        Swelling  And  Deep  Laceration   Present           -  Bleeding  Has  Subsided        Pt    Is  Awake  And  Alert     Unknown  As   To   Last  Tetanus  Shot

## 2015-02-02 NOTE — ED Notes (Signed)
Pt. tripped and fell at home last night , no LOC /ambulatory , seen at urgent care this evening sent here for evaluation of left knee laceration .

## 2015-02-03 MED ORDER — CEPHALEXIN 500 MG PO CAPS: 500.0000 mg | ORAL_CAPSULE | Freq: Four times a day (QID) | ORAL | Status: DC

## 2015-07-25 ENCOUNTER — Other Ambulatory Visit: Payer: Self-pay

## 2015-07-25 DIAGNOSIS — Z1231 Encounter for screening mammogram for malignant neoplasm of breast: Secondary | ICD-10-CM

## 2015-08-09 ENCOUNTER — Ambulatory Visit: Payer: Managed Care, Other (non HMO)

## 2015-08-24 ENCOUNTER — Ambulatory Visit
Admission: RE | Admit: 2015-08-24 | Discharge: 2015-08-24 | Disposition: A | Discharge status: Home / Self Care | Payer: Managed Care, Other (non HMO) | Source: Ambulatory Visit

## 2015-08-24 DIAGNOSIS — Z1231 Encounter for screening mammogram for malignant neoplasm of breast: Secondary | ICD-10-CM

## 2015-08-27 ENCOUNTER — Other Ambulatory Visit: Payer: Self-pay | Admitting: Obstetrics & Gynecology

## 2015-08-27 DIAGNOSIS — R928 Other abnormal and inconclusive findings on diagnostic imaging of breast: Secondary | ICD-10-CM

## 2015-08-29 ENCOUNTER — Ambulatory Visit (INDEPENDENT_AMBULATORY_CARE_PROVIDER_SITE_OTHER): Payer: Managed Care, Other (non HMO) | Admitting: Certified Nurse Midwife

## 2015-08-29 ENCOUNTER — Telehealth: Payer: Self-pay

## 2015-08-29 ENCOUNTER — Encounter: Payer: Self-pay | Admitting: Certified Nurse Midwife

## 2015-08-29 VITALS — BP 152/87 | HR 84 | Wt 163.0 lb

## 2015-08-29 DIAGNOSIS — F329 Major depressive disorder, single episode, unspecified: Secondary | ICD-10-CM

## 2015-08-29 DIAGNOSIS — N939 Abnormal uterine and vaginal bleeding, unspecified: Secondary | ICD-10-CM

## 2015-08-29 DIAGNOSIS — Z716 Tobacco abuse counseling: Secondary | ICD-10-CM

## 2015-08-29 DIAGNOSIS — Z01419 Encounter for gynecological examination (general) (routine) without abnormal findings: Secondary | ICD-10-CM

## 2015-08-29 DIAGNOSIS — F32A Depression, unspecified: Secondary | ICD-10-CM

## 2015-08-29 DIAGNOSIS — T7491XA Unspecified adult maltreatment, confirmed, initial encounter: Secondary | ICD-10-CM

## 2015-08-29 DIAGNOSIS — D259 Leiomyoma of uterus, unspecified: Secondary | ICD-10-CM | POA: Insufficient documentation

## 2015-08-29 MED ORDER — BUPROPION HCL ER (XL) 300 MG PO TB24
300.0000 mg | ORAL_TABLET | Freq: Every day | ORAL | Status: DC
Start: 2015-08-29 — End: 2017-09-01
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
## 2015-08-29 NOTE — Telephone Encounter (Signed)
ATIENT DOES NOT KNOW HER WORK [place] YET AND PREFERRED TO MAKE HER OWN APPT - WE GOT HER SET-UP IN DR. CATHERINE MATTHEWS' SYSTEM AND SENT HER RECORDS TO THEM, SO WHEN SHE CALLS, THEY HAVE EVERYTHING  ALSO, SHE TOOK CARD FOR JOURNEY'S - WILL [place] WHEN SHE KNOWS HER DAYS OFF

## 2015-08-31 LAB — PAP, TP IMAGING W/ HPV RNA, RFLX HPV TYPE 16,18/45: HPV MRNA, HIGH RISK: NOT DETECTED

## 2015-09-02 LAB — SURESWAB BACTERIAL VAGINOSIS/ITIS
Atopobium vaginae: NOT DETECTED Log (cells/mL)
C. albicans, DNA: NOT DETECTED
C. glabrata, DNA: NOT DETECTED
C. parapsilosis, DNA: NOT DETECTED
C. tropicalis, DNA: NOT DETECTED
GARDNERELLA VAGINALIS: NOT DETECTED Log (cells/mL)
LACTOBACILLUS SPECIES: 5.8 Log (cells/mL)
MEGASPHAERA SPECIES: NOT DETECTED Log (cells/mL)
T. vaginalis RNA, QL TMA: NOT DETECTED

## 2015-09-04 ENCOUNTER — Other Ambulatory Visit: Payer: Self-pay | Admitting: Certified Nurse Midwife

## 2015-09-04 ENCOUNTER — Ambulatory Visit (INDEPENDENT_AMBULATORY_CARE_PROVIDER_SITE_OTHER): Payer: Managed Care, Other (non HMO) | Admitting: *Deleted

## 2015-09-04 ENCOUNTER — Telehealth: Payer: Self-pay | Admitting: *Deleted

## 2015-09-04 VITALS — BP 132/83 | HR 83 | Temp 97.9°F | Wt 158.0 lb

## 2015-09-04 DIAGNOSIS — N939 Abnormal uterine and vaginal bleeding, unspecified: Secondary | ICD-10-CM

## 2015-09-04 MED ORDER — MEDROXYPROGESTERONE ACETATE 150 MG/ML IM SUSP: 150.0000 mg | INTRAMUSCULAR | Status: DC

## 2015-09-04 MED ORDER — MEDROXYPROGESTERONE ACETATE 150 MG/ML IM SUSP
150.0000 mg | INTRAMUSCULAR | Status: AC
  Administered 2015-09-04: 150 mg via INTRAMUSCULAR

## 2015-09-04 NOTE — Progress Notes (Signed)
Patient in office today for a Depo injection. Patient has an appointment with Dr. Zigmund Daniel tomorrow for a consult to address her chronic bleeding and pain. Patient encourage to keep her appointment. Patient tolerated injection well.  BP 132/83 mmHg  Pulse 83  Temp(Src) 97.9 F (36.6 C)  Wt 158 lb (71.668 kg)  LMP 08/19/2015

## 2015-09-04 NOTE — Telephone Encounter (Signed)
Spoke with patient.  Patient is wanting depo injection to stop her bleeding.  She has an appointment tomorrow morning with Dr. Zigmund Daniel.  She states that her bleeding is worse.  Will come by today for Depo injection at lunch time.  R.Jaquay Posthumus CNM

## 2015-09-04 NOTE — Telephone Encounter (Signed)
Patient was seen last week and referred for surgical consult. She has been bleeding and would like a shot to stop that until she can be seen by the consulting doctor about a more permanent solution. Call forwarded to [facility] for evaluation and response to request- high dose Depo is given at [facility].

## 2015-09-07 ENCOUNTER — Ambulatory Visit
Admission: RE | Admit: 2015-09-07 | Discharge: 2015-09-07 | Disposition: A | Discharge status: Home / Self Care | Payer: Managed Care, Other (non HMO) | Source: Ambulatory Visit | Attending: Obstetrics & Gynecology | Admitting: Obstetrics & Gynecology

## 2015-09-07 DIAGNOSIS — R928 Other abnormal and inconclusive findings on diagnostic imaging of breast: Secondary | ICD-10-CM

## 2015-09-10 ENCOUNTER — Other Ambulatory Visit: Payer: Self-pay | Admitting: Obstetrics

## 2015-09-10 ENCOUNTER — Telehealth: Payer: Self-pay | Admitting: *Deleted

## 2015-09-10 DIAGNOSIS — N939 Abnormal uterine and vaginal bleeding, unspecified: Secondary | ICD-10-CM

## 2015-09-10 MED ORDER — MEGESTROL ACETATE 40 MG PO TABS: ORAL_TABLET | ORAL | Status: DC

## 2015-09-10 NOTE — Telephone Encounter (Signed)
Patient is scheduled for surgery- but she is still bleeding and doesn't know if she can wait until March. She had a Depo shot last week and is having on/off bleeding with clots and pain. She is on her feet 8-10 hours/day. She saw Dr Zigmund Daniel and has [place] and Endometrial bx. She is scheduled for surgery 3/2. She called them and they told her we would have to help her with the bleeding.  After reviewing everything with Dr Jodi Mourning- he is sending in Megace Rx for her- call her and left message on her cell- with instructions.

## 2015-10-11 ENCOUNTER — Other Ambulatory Visit: Payer: Self-pay | Admitting: Obstetrics

## 2016-03-03 ENCOUNTER — Encounter (HOSPITAL_COMMUNITY): Payer: Self-pay

## 2016-03-03 ENCOUNTER — Emergency Department (HOSPITAL_COMMUNITY)
Admission: EM | Admit: 2016-03-03 | Discharge: 2016-03-03 | Disposition: A | Discharge status: Home / Self Care | Payer: Managed Care, Other (non HMO) | Attending: Emergency Medicine | Admitting: Emergency Medicine

## 2016-03-03 DIAGNOSIS — Z79899 Other long term (current) drug therapy: Secondary | ICD-10-CM | POA: Insufficient documentation

## 2016-03-03 DIAGNOSIS — R42 Dizziness and giddiness: Secondary | ICD-10-CM | POA: Diagnosis not present

## 2016-03-03 DIAGNOSIS — F17200 Nicotine dependence, unspecified, uncomplicated: Secondary | ICD-10-CM | POA: Diagnosis not present

## 2016-03-03 DIAGNOSIS — R197 Diarrhea, unspecified: Secondary | ICD-10-CM | POA: Diagnosis not present

## 2016-03-03 DIAGNOSIS — R112 Nausea with vomiting, unspecified: Secondary | ICD-10-CM | POA: Insufficient documentation

## 2016-03-03 DIAGNOSIS — I10 Essential (primary) hypertension: Secondary | ICD-10-CM | POA: Insufficient documentation

## 2016-03-03 DIAGNOSIS — R630 Anorexia: Secondary | ICD-10-CM | POA: Diagnosis not present

## 2016-03-03 LAB — URINALYSIS, ROUTINE W REFLEX MICROSCOPIC
BILIRUBIN URINE: NEGATIVE
Glucose, UA: NEGATIVE mg/dL
KETONES UR: NEGATIVE mg/dL
Leukocytes, UA: NEGATIVE
NITRITE: NEGATIVE
PH: 5 (ref 5.0–8.0)
PROTEIN: NEGATIVE mg/dL
Specific Gravity, Urine: 1.022 (ref 1.005–1.030)

## 2016-03-03 LAB — CBC
HCT: 45.6 % (ref 36.0–46.0)
Hemoglobin: 15.6 g/dL — ABNORMAL HIGH (ref 12.0–15.0)
MCH: 29.8 pg (ref 26.0–34.0)
MCHC: 34.2 g/dL (ref 30.0–36.0)
MCV: 87.2 fL (ref 78.0–100.0)
Platelets: 228 10*3/uL (ref 150–400)
RBC: 5.23 MIL/uL — ABNORMAL HIGH (ref 3.87–5.11)
RDW: 15 % (ref 11.5–15.5)
WBC: 9.5 10*3/uL (ref 4.0–10.5)

## 2016-03-03 LAB — COMPREHENSIVE METABOLIC PANEL
ALT: 37 U/L (ref 14–54)
ANION GAP: 9 (ref 5–15)
AST: 25 U/L (ref 15–41)
Albumin: 4.2 g/dL (ref 3.5–5.0)
Alkaline Phosphatase: 72 U/L (ref 38–126)
BUN: 19 mg/dL (ref 6–20)
CO2: 19 mmol/L — ABNORMAL LOW (ref 22–32)
Calcium: 9.1 mg/dL (ref 8.9–10.3)
Chloride: 107 mmol/L (ref 101–111)
Creatinine, Ser: 0.67 mg/dL (ref 0.44–1.00)
GFR calc Af Amer: >60 mL/min (ref >60)
Glucose, Bld: 99 mg/dL (ref 65–99)
POTASSIUM: 4 mmol/L (ref 3.5–5.1)
Sodium: 135 mmol/L (ref 135–145)
TOTAL PROTEIN: 7.2 g/dL (ref 6.5–8.1)
Total Bilirubin: 0.3 mg/dL (ref 0.3–1.2)

## 2016-03-03 LAB — URINE MICROSCOPIC-ADD ON

## 2016-03-03 LAB — LIPASE, BLOOD: Lipase: 28 U/L (ref 11–51)

## 2016-03-03 MED ORDER — SODIUM CHLORIDE 0.9 % IV BOLUS (SEPSIS)
1000.0000 mL | Freq: Once | INTRAVENOUS | Status: AC
  Administered 2016-03-03: 1000 mL via INTRAVENOUS

## 2016-03-03 MED ORDER — ONDANSETRON 8 MG PO TBDP: 8.0000 mg | ORAL_TABLET | Freq: Three times a day (TID) | ORAL | Status: DC | PRN

## 2016-03-03 MED ORDER — ONDANSETRON HCL 4 MG/2ML IJ SOLN
4.0000 mg | Freq: Once | INTRAMUSCULAR | Status: AC
  Administered 2016-03-03: 4 mg via INTRAVENOUS
  Filled 2016-03-03: qty 2

## 2016-03-03 MED ORDER — LOPERAMIDE HCL 2 MG PO CAPS: 2.0000 mg | ORAL_CAPSULE | Freq: Four times a day (QID) | ORAL | Status: DC | PRN

## 2016-03-03 NOTE — ED Notes (Signed)
Pt comes in today with a c/o nausea and diarrhea. Pt states this started after eating green beans on Saturday. Pt denies any vomiting. Pt states that she has felt generalized weakness since the encounter.

## 2016-03-03 NOTE — ED Provider Notes (Signed)
CSN: [id number]     Arrival date & time 03/03/16  1136 History   First MD Initiated Contact with Patient 03/03/16 1200     Chief Complaint  Patient presents with  . Emesis  . Diarrhea      Patient is a 50 y.o. female presenting with vomiting and diarrhea.  Emesis Associated symptoms: diarrhea   Associated symptoms: no abdominal pain   Diarrhea Associated symptoms: vomiting   Associated symptoms: no abdominal pain   Patient presents with nausea vomiting and diarrhea. He's had it the last 2 days. Thinks she ate something bad from [facility]. States she found something in her green beans. She brought the piece with her and appears to be a seed or something like that. Mild abdominal pain. States she's felt lightheaded. States she's had some chills. No dysuria. She's had a decreased appetite. She is not taking anything for it. Food makes her more nauseous.  Past Medical History  Diagnosis Date  . Hypertension    Past Surgical History  Procedure Laterality Date  . Tubal ligation    . Abdominal hysterectomy     Family History  Problem Relation Age of Onset  . Cancer Maternal Grandmother   . Cancer Mother    Social History  Substance Use Topics  . Smoking status: Current Every Day Smoker -- 0.00 packs/day for 0 years  . Smokeless tobacco: Never Used  . Alcohol Use: 0.0 oz/week    0 Standard drinks or equivalent per week   OB History    Gravida Para Term Preterm AB TAB SAB Ectopic Multiple Living   2 2 1 1  0 0 0 0 0 3     Review of Systems  Constitutional: Positive for appetite change.  Cardiovascular: Negative for chest pain.  Gastrointestinal: Positive for nausea, vomiting and diarrhea. Negative for abdominal pain.  Genitourinary: Negative for dysuria.  Musculoskeletal: Negative for back pain.  Skin: Negative for wound.  Neurological: Positive for light-headedness. Negative for weakness.      Allergies  Penicillins  Home Medications   Prior to Admission medications    Medication Sig Start Date End Date Taking? Authorizing Provider  amLODipine-valsartan (EXFORGE) 10-160 MG tablet Take 1 tablet by mouth daily with breakfast.   Yes Historical Provider, MD  Cholecalciferol (VITAMIN D3) 5000 units CAPS Take 1 capsule by mouth daily with breakfast.   Yes Historical Provider, MD  Multiple Vitamin (MULTIVITAMIN WITH MINERALS) TABS tablet Take 1 tablet by mouth daily.   Yes Historical Provider, MD  Turmeric 500 MG TABS Take 500 mg by mouth every other day.   Yes Historical Provider, MD  buPROPion (WELLBUTRIN XL) 300 MG 24 hr tablet Take 1 tablet (300 mg total) by mouth daily. Patient not taking: Reported on 03/03/2016 08/29/15   Morene Crocker, CNM  loperamide (IMODIUM) 2 MG capsule Take 1 capsule (2 mg total) by mouth 4 (four) times daily as needed for diarrhea or loose stools. 03/03/16   Davonna Belling, MD  medroxyPROGESTERone (DEPO-PROVERA) 150 MG/ML injection Inject 1 mL (150 mg total) into the muscle every 3 (three) months. Patient not taking: Reported on 03/03/2016 09/04/15   Morene Crocker, CNM  megestrol (MEGACE) 40 MG tablet 1 tablet po tid x 3 days.  1 tablet bid x 3 days.  1 tablet daily. Patient not taking: Reported on 03/03/2016 09/10/15   Shelly Bombard, MD  ondansetron (ZOFRAN-ODT) 8 MG disintegrating tablet Take 1 tablet (8 mg total) by mouth every 8 (eight) hours as  needed for nausea or vomiting. 03/03/16   Davonna Belling, MD   BP 134/94 mmHg  Pulse 73  Temp(Src) 98.6 F (37 C) (Oral)  Resp 16  SpO2 97%  LMP 08/19/2015 Physical Exam  Constitutional: She appears well-developed.  HENT:  Head: Atraumatic.  Neck: Neck supple.  Pulmonary/Chest: Effort normal and breath sounds normal.  Abdominal: Soft. There is no tenderness.  Musculoskeletal: She exhibits no edema.  Neurological: She is alert.  Skin: Skin is warm.  Multiple tattoos.    ED Course  Procedures (including critical care time) Labs Review Labs Reviewed  COMPREHENSIVE  METABOLIC PANEL - Abnormal; Notable for the following:    CO2 19 (*)    All other components within normal limits  CBC - Abnormal; Notable for the following:    RBC 5.23 (*)    Hemoglobin 15.6 (*)    All other components within normal limits  URINALYSIS, ROUTINE W REFLEX MICROSCOPIC (NOT AT [facility]) - Abnormal; Notable for the following:    Hgb urine dipstick LARGE (*)    All other components within normal limits  URINE MICROSCOPIC-ADD ON - Abnormal; Notable for the following:    Squamous Epithelial / LPF 0-5 (*)    Bacteria, UA FEW (*)    All other components within normal limits  LIPASE, BLOOD    Imaging Review No results found. I have personally reviewed and evaluated these images and lab results as part of my medical decision-making.   EKG Interpretation None      MDM   Final diagnoses:  Nausea vomiting and diarrhea    Patient with nausea vomiting diarrhea. Feels better after treatment and will discharge home.    Davonna Belling, MD 03/04/16 1807

## 2016-03-03 NOTE — Discharge Instructions (Signed)

## 2016-03-03 NOTE — ED Notes (Signed)
Pt started with n/v/d yesterday.  No recent antibiotics.  Feels it is food poisoning from [facility].  Abdominal pain

## 2016-03-03 NOTE — ED Notes (Signed)
Dr Pickering at bedside 

## 2016-03-03 NOTE — ED Notes (Signed)
Pt tolerates ice water and saline crackers with emesis or severe nausea.

## 2016-10-10 ENCOUNTER — Other Ambulatory Visit: Payer: Self-pay | Admitting: Obstetrics

## 2016-10-10 DIAGNOSIS — Z1231 Encounter for screening mammogram for malignant neoplasm of breast: Secondary | ICD-10-CM

## 2016-10-28 ENCOUNTER — Ambulatory Visit: Payer: Managed Care, Other (non HMO)

## 2017-05-24 ENCOUNTER — Encounter (HOSPITAL_COMMUNITY): Payer: Self-pay | Admitting: *Deleted

## 2017-05-24 ENCOUNTER — Emergency Department (HOSPITAL_COMMUNITY)
Admission: EM | Admit: 2017-05-24 | Discharge: 2017-05-24 | Disposition: A | Discharge status: Home / Self Care | Payer: 59 | Attending: Emergency Medicine | Admitting: Emergency Medicine

## 2017-05-24 DIAGNOSIS — W19XXXA Unspecified fall, initial encounter: Secondary | ICD-10-CM

## 2017-05-24 DIAGNOSIS — Y92000 Kitchen of unspecified non-institutional (private) residence as  the place of occurrence of the external cause: Secondary | ICD-10-CM | POA: Insufficient documentation

## 2017-05-24 DIAGNOSIS — W01198A Fall on same level from slipping, tripping and stumbling with subsequent striking against other object, initial encounter: Secondary | ICD-10-CM | POA: Diagnosis not present

## 2017-05-24 DIAGNOSIS — Y998 Other external cause status: Secondary | ICD-10-CM | POA: Diagnosis not present

## 2017-05-24 DIAGNOSIS — Y939 Activity, unspecified: Secondary | ICD-10-CM | POA: Diagnosis not present

## 2017-05-24 DIAGNOSIS — I10 Essential (primary) hypertension: Secondary | ICD-10-CM | POA: Insufficient documentation

## 2017-05-24 DIAGNOSIS — S0083XA Contusion of other part of head, initial encounter: Secondary | ICD-10-CM | POA: Diagnosis not present

## 2017-05-24 DIAGNOSIS — S1081XA Abrasion of other specified part of neck, initial encounter: Secondary | ICD-10-CM

## 2017-05-24 DIAGNOSIS — Z79899 Other long term (current) drug therapy: Secondary | ICD-10-CM | POA: Diagnosis not present

## 2017-05-24 DIAGNOSIS — S0990XA Unspecified injury of head, initial encounter: Secondary | ICD-10-CM | POA: Diagnosis not present

## 2017-05-24 DIAGNOSIS — S161XXA Strain of muscle, fascia and tendon at neck level, initial encounter: Secondary | ICD-10-CM | POA: Diagnosis not present

## 2017-05-24 DIAGNOSIS — S0081XA Abrasion of other part of head, initial encounter: Secondary | ICD-10-CM | POA: Insufficient documentation

## 2017-05-24 DIAGNOSIS — F17200 Nicotine dependence, unspecified, uncomplicated: Secondary | ICD-10-CM | POA: Diagnosis not present

## 2017-05-24 DIAGNOSIS — L089 Local infection of the skin and subcutaneous tissue, unspecified: Secondary | ICD-10-CM

## 2017-05-24 MED ORDER — CYCLOBENZAPRINE HCL 10 MG PO TABS
10.0000 mg | ORAL_TABLET | Freq: Every day | ORAL | 0 refills | Status: AC
Start: 2017-05-24 — End: 2017-06-03

## 2017-05-24 MED ORDER — ACETAMINOPHEN 500 MG PO TABS
1000.0000 mg | ORAL_TABLET | Freq: Three times a day (TID) | ORAL | 0 refills | Status: AC
Start: 2017-05-24 — End: 2017-05-29

## 2017-05-24 NOTE — ED Triage Notes (Signed)
Pt reports tripping and falling on Friday night, hit side of face on cabinet and reports +loc. Is not on blood thinners. Has bruising and swelling noted to left eye and reports neck pain.

## 2017-05-24 NOTE — Discharge Instructions (Signed)
You may use over-the-counter Motrin (Ibuprofen), Acetaminophen (Tylenol), topical muscle creams such as SalonPas, Icy Hot, Bengay, etc. Please stretch, apply heat, and have massage therapy for additional assistance. ° °

## 2017-05-24 NOTE — ED Provider Notes (Signed)
[facility] DEPT Provider Note   CSN: [id number] Arrival date & time: 05/24/17  1547     History   Chief Complaint Chief Complaint  Patient presents with  . Fall    HPI Jill Hanna is a 51 y.o. female.  HPI  51 year old female presents for facial trauma, left-sided neck pain, mild headache since mechanical fall 2 days ago. Patient reports that she tripped over her pant leg while in the kitchen causing her to fall and hitting a cabinet on the way down. She is unsure of loss of consciousness but states it may be possible. Following the fall patient had mild facial pain. Reported waking up the next morning with left-sided neck pain, mild headache. Symptoms mildly improved with over-the-counter medication. She denies any focal weakness, vomiting, visual disturbances, dizziness. She denies any other physical complaints.  Patient is mostly concerned about the abrasions around her left face. She is asking for cream to make them go away before she has to go back to work tomorrow.  Past Medical History:  Diagnosis Date  . Hypertension     Patient Active Problem List   Diagnosis Date Noted  . Uterine fibroid 08/29/2015  . Pelvic pain 05/20/2013  . Hx of Abnormal Pap smear 05/20/2013  . Dysuria 05/20/2013  . Smoker 05/20/2013    Past Surgical History:  Procedure Laterality Date  . ABDOMINAL HYSTERECTOMY    . TUBAL LIGATION      OB History    Gravida Para Term Preterm AB Living   2 2 1 1  0 3   SAB TAB Ectopic Multiple Live Births   0 0 0 0 2       Home Medications    Prior to Admission medications   Medication Sig Start Date End Date Taking? Authorizing Provider  amLODipine-valsartan (EXFORGE) 10-160 MG tablet Take 1 tablet by mouth daily with breakfast.   Yes [provider]  Cholecalciferol (VITAMIN D3) 5000 units CAPS Take 1 capsule by mouth daily with breakfast.   Yes [provider]  ibuprofen (ADVIL,MOTRIN) 400 MG tablet Take 400 mg by mouth  every 6 (six) hours as needed for headache or mild pain.   Yes [provider]  Multiple Vitamin (MULTIVITAMIN WITH MINERALS) TABS tablet Take 1 tablet by mouth daily.   Yes [provider]  acetaminophen (TYLENOL) 500 MG tablet Take 2 tablets (1,000 mg total) by mouth every 8 (eight) hours. Do not take more than 4000 mg of acetaminophen (Tylenol) in a 24-hour period. Please note that other medicines that you may be prescribed may have Tylenol as well. 05/24/17 05/29/17  Fatima Blank, MD  buPROPion (WELLBUTRIN XL) 300 MG 24 hr tablet Take 1 tablet (300 mg total) by mouth daily. Patient not taking: Reported on 03/03/2016 08/29/15   Kandis Cocking A, CNM  cyclobenzaprine (FLEXERIL) 10 MG tablet Take 1 tablet (10 mg total) by mouth at bedtime. 05/24/17 06/03/17  Fatima Blank, MD    Family History Family History  Problem Relation Age of Onset  . Cancer Mother   . Cancer Maternal Grandmother     Social History Social History  Substance Use Topics  . Smoking status: Current Every Day Smoker    Packs/day: 0.00    Years: 0.00  . Smokeless tobacco: Never Used  . Alcohol use 0.0 oz/week     Allergies   Penicillins   Review of Systems Review of Systems All other systems are reviewed and are negative for acute change except  as noted in the HPI   Physical Exam Updated Vital Signs BP (!) 125/105 (BP Location: Left Arm)   Pulse 83   Temp 98.3 F (36.8 C) (Oral)   Resp 16   Ht 5\' 4"  (1.626 m)   Wt 75.8 kg (167 lb)   LMP 08/19/2015   SpO2 96%   BMI 28.67 kg/m   Physical Exam  Constitutional: She is oriented to person, place, and time. She appears well-developed and well-nourished. No distress.  HENT:  Head: Normocephalic.    Right Ear: External ear normal.  Left Ear: External ear normal.  Nose: Nose normal.  Eyes: Pupils are equal, round, and reactive to light. Conjunctivae and EOM are normal. Right eye exhibits no discharge. Left eye  exhibits no discharge. No scleral icterus.  Neck: Normal range of motion. Neck supple. Muscular tenderness present. No spinous process tenderness present.    Cardiovascular: Normal rate, regular rhythm and normal heart sounds.  Exam reveals no gallop and no friction rub.   No murmur heard. Pulses:      Radial pulses are 2+ on the right side, and 2+ on the left side.       Dorsalis pedis pulses are 2+ on the right side, and 2+ on the left side.  Pulmonary/Chest: Effort normal and breath sounds normal. No stridor. No respiratory distress. She has no wheezes.  Abdominal: Soft. She exhibits no distension. There is no tenderness.  Musculoskeletal: She exhibits no edema or tenderness.       Cervical back: She exhibits no bony tenderness.       Thoracic back: She exhibits no bony tenderness.       Lumbar back: She exhibits no bony tenderness.  Clavicles stable. Chest stable to AP/Lat compression. Pelvis stable to Lat compression. No obvious extremity deformity. No chest or abdominal wall contusion.  Neurological: She is alert and oriented to person, place, and time.  Mental Status: Alert and oriented to person, place, and time. Attention and concentration normal. Speech clear. Recent memory is intact  Cranial Nerves  II Visual Fields: Intact to confrontation. Visual fields intact. III, IV, VI: Pupils equal and reactive to light and near. Full eye movement without nystagmus  V Facial Sensation: Normal. No weakness of masticatory muscles  VII: No facial weakness or asymmetry  VIII Auditory Acuity: Grossly normal  IX/X: The uvula is midline; the palate elevates symmetrically  XI: Normal sternocleidomastoid and trapezius strength  XII: The tongue is midline. No atrophy or fasciculations.   Motor System: Muscle Strength: 5/5 and symmetric in the upper and lower extremities. No pronation or drift.  Muscle Tone: Tone and muscle bulk are normal in the upper and lower extremities.   Reflexes:  DTRs: 1+ and symmetrical in all four extremities. Plantar responses are flexor bilaterally.  Coordination: Intact finger-to-nose, heel-to-shin. No tremor.  Sensation: Intact to light touch, and pinprick.    Skin: Skin is warm and dry. No rash noted. She is not diaphoretic. No erythema.  Psychiatric: She has a normal mood and affect.     ED Treatments / Results  Labs (all labs ordered are listed, but only abnormal results are displayed) Labs Reviewed - No data to display  EKG  EKG Interpretation None       Radiology No results found.  Procedures Procedures (including critical care time)  Medications Ordered in ED Medications - No data to display   Initial Impression / Assessment and Plan / ED Course  I have reviewed the triage  vital signs and the nursing notes.  Pertinent labs & imaging results that were available during my care of the patient were reviewed by me and considered in my medical decision making (see chart for details).     Minor closed head injury. Normal neuro exam. No anticoagulation. No indication for imaging at this time.   Doubt intracranial bleed or skull fracture.   Will monitor in the ED for any changes.  There were no changes that would warrant imaging or admission for further observation. The patient is appropriate for discharge home.  No cervical midline tenderness warranting imaging. Consistent with muscle strain. No evidence to suggest carotid dissection.  Pt given information on head trauma including strict instructions to return for nausea/vomiting, confusion, altered level of consciousness or new weakness. Pt understands and is agreeable to the plan.  The patient is safe for discharge with strict return precautions.   Final Clinical Impressions(s) / ED Diagnoses   Final diagnoses:  Fall, initial encounter  Contusion of face, initial encounter  Abrasion of face with infection, initial encounter  Minor head injury, initial encounter   Strain of neck muscle, initial encounter   Disposition: Discharge  Condition: Good  I have discussed the results, Dx and Tx plan with the patient who expressed understanding and agree(s) with the plan. Discharge instructions discussed at great length. The patient was given strict return precautions who verbalized understanding of the instructions. No further questions at time of discharge.    New Prescriptions   ACETAMINOPHEN (TYLENOL) 500 MG TABLET    Take 2 tablets (1,000 mg total) by mouth every 8 (eight) hours. Do not take more than 4000 mg of acetaminophen (Tylenol) in a 24-hour period. Please note that other medicines that you may be prescribed may have Tylenol as well.   CYCLOBENZAPRINE (FLEXERIL) 10 MG TABLET    Take 1 tablet (10 mg total) by mouth at bedtime.    Follow Up: Lucianne Lei, [address] [place] [address] [phone]  Schedule an appointment as soon as possible for a visit  As needed      Cardama, Grayce Sessions, MD 05/24/17 1719

## 2017-08-17 ENCOUNTER — Ambulatory Visit (INDEPENDENT_AMBULATORY_CARE_PROVIDER_SITE_OTHER): Payer: 59 | Admitting: Obstetrics

## 2017-08-17 ENCOUNTER — Encounter: Payer: Self-pay | Admitting: Obstetrics

## 2017-08-17 VITALS — BP 151/90 | HR 94 | Temp 98.0°F | Wt 167.0 lb

## 2017-08-17 DIAGNOSIS — N644 Mastodynia: Secondary | ICD-10-CM

## 2017-08-17 NOTE — Progress Notes (Signed)
RGYN pt  here  Today because she went to schedule her Mammogram and was told she had to schedule appt with [place] to get her mammogram referral. Pt had Mammogram 08/24/2015 then a Diagnostic mammogram: 09/15/2015 WNL Pt did not have mammogram last yr.

## 2017-08-18 ENCOUNTER — Encounter: Payer: Self-pay | Admitting: Obstetrics

## 2017-08-18 NOTE — Progress Notes (Signed)
Patient ID: Jill Hanna, female   DOB: January 27, 1966, 52 y.o.   MRN: [id number]  Chief Complaint  Patient presents with  . Breast Problem    HPI Jill Hanna is a 52 y.o. female.  Breast pain, left breast.  No masses felt. HPI  Past Medical History:  Diagnosis Date  . Hypertension     Past Surgical History:  Procedure Laterality Date  . ABDOMINAL HYSTERECTOMY    . TUBAL LIGATION      Family History  Problem Relation Age of Onset  . Cancer Mother   . Cancer Maternal Grandmother     Social History Social History   Tobacco Use  . Smoking status: Current Every Day Smoker    Packs/day: 0.00    Years: 0.00    Pack years: 0.00  . Smokeless tobacco: Never Used  Substance Use Topics  . Alcohol use: Yes    Alcohol/week: 0.0 oz  . Drug use: No    Allergies  Allergen Reactions  . Penicillins Hives    Has patient had a PCN reaction causing immediate rash, facial/tongue/throat swelling, SOB or lightheadedness with hypotension: No Has patient had a PCN reaction causing severe rash involving mucus membranes or skin necrosis: Yes Has patient had a PCN reaction that required hospitalization No Has patient had a PCN reaction occurring within the last 10 years: No If all of the above answers are "NO", then may proceed with Cephalosporin use.     Current Outpatient Medications  Medication Sig Dispense Refill  . amLODipine-valsartan (EXFORGE) 10-160 MG tablet Take 1 tablet by mouth daily with breakfast.    . Cholecalciferol (VITAMIN D3) 5000 units CAPS Take 1 capsule by mouth daily with breakfast.    . Multiple Vitamin (MULTIVITAMIN WITH MINERALS) TABS tablet Take 1 tablet by mouth daily.    Marland Kitchen buPROPion (WELLBUTRIN XL) 300 MG 24 hr tablet Take 1 tablet (300 mg total) by mouth daily. (Patient not taking: Reported on 03/03/2016) 30 tablet 6  . ibuprofen (ADVIL,MOTRIN) 400 MG tablet Take 400 mg by mouth every 6 (six) hours as needed for headache or mild pain.     No current  facility-administered medications for this visit.     Review of Systems Review of Systems Constitutional: negative for fatigue and weight loss Respiratory: negative for cough and wheezing Cardiovascular: negative for chest pain, fatigue and palpitations Gastrointestinal: negative for abdominal pain and change in bowel habits Genitourinary:negative Integument/breast: positive for left breast pain Musculoskeletal:negative for myalgias Neurological: negative for gait problems and tremors Behavioral/Psych: negative for abusive relationship, depression Endocrine: negative for temperature intolerance      Blood pressure (!) 151/90, pulse 94, temperature 98 F (36.7 C), temperature source Oral, weight 167 lb (75.8 kg), last menstrual period 08/19/2015.  Physical Exam Physical Exam General:   alert  Skin:   no rash or abnormalities  Lungs:   clear to auscultation bilaterally  Heart:   regular rate and rhythm, S1, S2 normal, no murmur, click, rub or gallop  Breasts:   normal without suspicious masses, skin or nipple changes or axillary nodes  50% of 15 min visit spent on counseling and coordination of care.   Data Reviewed None  Assessment     1. Mastodynia of left breast Rx: - MM Digital Screening; Future    Plan    Follow up in 1 month for Annual  Orders Placed This Encounter  Procedures  . MM Digital Screening    Standing Status:   Future  Standing Expiration Date:   10/16/2018    Order Specific Question:   Reason for Exam (SYMPTOM  OR DIAGNOSIS REQUIRED)    Answer:   mastodynia    Order Specific Question:   Is the patient pregnant?    Answer:   No    Order Specific Question:   Preferred imaging location?    Answer:   [facility]   No orders of the defined types were placed in this encounter.

## 2017-08-26 ENCOUNTER — Other Ambulatory Visit: Payer: Self-pay | Admitting: Obstetrics

## 2017-08-26 DIAGNOSIS — N644 Mastodynia: Secondary | ICD-10-CM

## 2017-08-27 ENCOUNTER — Other Ambulatory Visit: Payer: Self-pay | Admitting: Obstetrics

## 2017-08-27 DIAGNOSIS — N644 Mastodynia: Secondary | ICD-10-CM

## 2017-09-01 ENCOUNTER — Ambulatory Visit: Payer: 59

## 2017-09-01 ENCOUNTER — Ambulatory Visit
Admission: RE | Admit: 2017-09-01 | Discharge: 2017-09-01 | Disposition: A | Discharge status: Home / Self Care | Payer: 59 | Source: Ambulatory Visit | Attending: Obstetrics | Admitting: Obstetrics

## 2017-09-01 ENCOUNTER — Other Ambulatory Visit: Payer: Self-pay | Admitting: Obstetrics

## 2017-09-01 ENCOUNTER — Encounter: Payer: Self-pay | Admitting: Certified Nurse Midwife

## 2017-09-01 ENCOUNTER — Ambulatory Visit (INDEPENDENT_AMBULATORY_CARE_PROVIDER_SITE_OTHER): Payer: 59 | Admitting: Certified Nurse Midwife

## 2017-09-01 VITALS — BP 123/73 | HR 92 | Ht 64.0 in | Wt 169.2 lb

## 2017-09-01 DIAGNOSIS — R921 Mammographic calcification found on diagnostic imaging of breast: Secondary | ICD-10-CM

## 2017-09-01 DIAGNOSIS — F17200 Nicotine dependence, unspecified, uncomplicated: Secondary | ICD-10-CM

## 2017-09-01 DIAGNOSIS — F3289 Other specified depressive episodes: Secondary | ICD-10-CM

## 2017-09-01 DIAGNOSIS — R399 Unspecified symptoms and signs involving the genitourinary system: Secondary | ICD-10-CM | POA: Diagnosis not present

## 2017-09-01 DIAGNOSIS — R319 Hematuria, unspecified: Secondary | ICD-10-CM

## 2017-09-01 DIAGNOSIS — Z01419 Encounter for gynecological examination (general) (routine) without abnormal findings: Secondary | ICD-10-CM | POA: Diagnosis not present

## 2017-09-01 DIAGNOSIS — N644 Mastodynia: Secondary | ICD-10-CM

## 2017-09-01 LAB — POCT URINALYSIS DIPSTICK
Bilirubin, UA: NEGATIVE
GLUCOSE UA: NEGATIVE
KETONES UA: NEGATIVE
Leukocytes, UA: NEGATIVE
Nitrite, UA: NEGATIVE
SPEC GRAV UA: 1.015 (ref 1.010–1.025)
Urobilinogen, UA: 0.2 E.U./dL
pH, UA: 5 (ref 5.0–8.0)

## 2017-09-01 MED ORDER — CITALOPRAM HYDROBROMIDE 20 MG PO TABS: 20.0000 mg | ORAL_TABLET | Freq: Every day | ORAL | 2 refills | Status: DC

## 2017-09-01 NOTE — Progress Notes (Signed)
MGM completed today. Scheduled for biopsy 09/10/17. Colonoscopy due. Pt c/o depression since Hysterectomy 2017.
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
## 2017-09-03 LAB — URINE CULTURE

## 2017-09-04 ENCOUNTER — Encounter: Payer: Self-pay | Admitting: Certified Nurse Midwife

## 2017-09-04 LAB — TESTOSTERONE, FREE, TOTAL, SHBG
SEX HORMONE BINDING: 93.3 nmol/L (ref 17.3–125.0)
TESTOSTERONE FREE: 1.4 pg/mL (ref 0.0–4.2)
TESTOSTERONE: 10 ng/dL (ref 3–41)

## 2017-09-04 LAB — PROGESTERONE: Progesterone: 11.1 ng/mL

## 2017-09-04 LAB — CBC WITH DIFFERENTIAL/PLATELET
BASOS: 1 %
Basophils Absolute: 0 10*3/uL (ref 0.0–0.2)
EOS (ABSOLUTE): 0.2 10*3/uL (ref 0.0–0.4)
EOS: 2 %
HEMATOCRIT: 44.3 % (ref 34.0–46.6)
Hemoglobin: 15.2 g/dL (ref 11.1–15.9)
Immature Grans (Abs): 0 10*3/uL (ref 0.0–0.1)
Immature Granulocytes: 0 %
LYMPHS: 23 %
Lymphocytes Absolute: 1.9 10*3/uL (ref 0.7–3.1)
MCH: 31.1 pg (ref 26.6–33.0)
MCHC: 34.3 g/dL (ref 31.5–35.7)
MCV: 91 fL (ref 79–97)
Monocytes Absolute: 0.4 10*3/uL (ref 0.1–0.9)
Monocytes: 5 %
NEUTROS ABS: 5.8 10*3/uL (ref 1.4–7.0)
Neutrophils: 69 %
PLATELETS: 234 10*3/uL (ref 150–379)
RBC: 4.88 x10E6/uL (ref 3.77–5.28)
RDW: 13.7 % (ref 12.3–15.4)
WBC: 8.3 10*3/uL (ref 3.4–10.8)

## 2017-09-04 LAB — COMPREHENSIVE METABOLIC PANEL
A/G RATIO: 1.8 (ref 1.2–2.2)
ALBUMIN: 4.2 g/dL (ref 3.5–5.5)
ALT: 71 IU/L — AB (ref 0–32)
AST: 42 IU/L — ABNORMAL HIGH (ref 0–40)
Alkaline Phosphatase: 76 IU/L (ref 39–117)
BILIRUBIN TOTAL: 0.3 mg/dL (ref 0.0–1.2)
BUN / CREAT RATIO: 21 (ref 9–23)
BUN: 15 mg/dL (ref 6–24)
CHLORIDE: 104 mmol/L (ref 96–106)
CO2: 23 mmol/L (ref 20–29)
Calcium: 9.5 mg/dL (ref 8.7–10.2)
Creatinine, Ser: 0.73 mg/dL (ref 0.57–1.00)
GFR, EST AFRICAN AMERICAN: 110 mL/min/{1.73_m2} (ref >59)
GFR, EST NON AFRICAN AMERICAN: 96 mL/min/{1.73_m2} (ref >59)
GLOBULIN, TOTAL: 2.4 g/dL (ref 1.5–4.5)
Glucose: 177 mg/dL — ABNORMAL HIGH (ref 65–99)
POTASSIUM: 3.9 mmol/L (ref 3.5–5.2)
SODIUM: 141 mmol/L (ref 134–144)
TOTAL PROTEIN: 6.6 g/dL (ref 6.0–8.5)

## 2017-09-04 LAB — HEMOGLOBIN A1C
Est. average glucose Bld gHb Est-mCnc: 126 mg/dL
Hgb A1c MFr Bld: 6 % — ABNORMAL HIGH (ref 4.8–5.6)

## 2017-09-04 LAB — CHOLESTEROL, TOTAL: Cholesterol, Total: 175 mg/dL (ref 100–199)

## 2017-09-04 LAB — TSH: TSH: 1.04 u[IU]/mL (ref 0.450–4.500)

## 2017-09-04 LAB — 17-HYDROXYPROGESTERONE: 17 HYDROXYPROGESTERONE: 136 ng/dL

## 2017-09-04 LAB — TRIGLYCERIDES: Triglycerides: 95 mg/dL (ref 0–149)

## 2017-09-04 LAB — PROLACTIN: Prolactin: 7.3 ng/mL (ref 4.8–23.3)

## 2017-09-04 LAB — ESTROGENS, TOTAL: ESTROGEN: 271 pg/mL

## 2017-09-04 LAB — VITAMIN D 25 HYDROXY (VIT D DEFICIENCY, FRACTURES): VIT D 25 HYDROXY: 37.3 ng/mL (ref 30.0–100.0)

## 2017-09-04 LAB — HDL CHOLESTEROL: HDL: 51 mg/dL (ref >39)

## 2017-09-10 ENCOUNTER — Ambulatory Visit
Admission: RE | Admit: 2017-09-10 | Discharge: 2017-09-10 | Disposition: A | Discharge status: Home / Self Care | Payer: 59 | Source: Ambulatory Visit | Attending: Obstetrics | Admitting: Obstetrics

## 2017-09-10 DIAGNOSIS — R921 Mammographic calcification found on diagnostic imaging of breast: Secondary | ICD-10-CM

## 2017-09-16 ENCOUNTER — Telehealth: Payer: Self-pay | Admitting: Pediatrics

## 2017-09-16 DIAGNOSIS — R7303 Prediabetes: Secondary | ICD-10-CM

## 2017-09-16 NOTE — Telephone Encounter (Signed)
-----   Message from Morene Crocker, CNM sent at 09/09/2017 10:51 AM EST ----- Please let her know that she is prediabetic, and 2 points from DM2. Please let her know the risk factors for developing diabetes, including inactivity, obesity, elevated blood pressure, and elevated cholesterol. And that she is encouraged to adopt healthy lifestyle habits and initiate risk factor reduction.  Please offer nutrition consult if not already done.  Thank you.  RDenney CNM  Please also let her know that her liver enzymes are elevated.  Needs to see primary care for management.  Her hormone levels are normal: still not in menopause.

## 2017-10-30 ENCOUNTER — Encounter: Payer: Self-pay | Admitting: Certified Nurse Midwife

## 2018-08-16 ENCOUNTER — Ambulatory Visit: Payer: 59 | Admitting: Advanced Practice Midwife

## 2018-08-16 ENCOUNTER — Encounter: Payer: Self-pay | Admitting: Advanced Practice Midwife

## 2018-08-16 VITALS — BP 174/90 | HR 99 | Resp 18 | Ht 64.0 in | Wt 165.4 lb

## 2018-08-16 DIAGNOSIS — Z90710 Acquired absence of both cervix and uterus: Secondary | ICD-10-CM

## 2018-08-16 DIAGNOSIS — Z113 Encounter for screening for infections with a predominantly sexual mode of transmission: Secondary | ICD-10-CM | POA: Diagnosis not present

## 2018-08-16 DIAGNOSIS — I10 Essential (primary) hypertension: Secondary | ICD-10-CM | POA: Diagnosis not present

## 2018-08-16 DIAGNOSIS — Z202 Contact with and (suspected) exposure to infections with a predominantly sexual mode of transmission: Secondary | ICD-10-CM

## 2018-08-16 DIAGNOSIS — N898 Other specified noninflammatory disorders of vagina: Secondary | ICD-10-CM

## 2018-08-16 DIAGNOSIS — B373 Candidiasis of vulva and vagina: Secondary | ICD-10-CM

## 2018-08-16 DIAGNOSIS — B3731 Acute candidiasis of vulva and vagina: Secondary | ICD-10-CM

## 2018-08-16 MED ORDER — FLUCONAZOLE 150 MG PO TABS: 150.0000 mg | ORAL_TABLET | Freq: Once | ORAL | 3 refills | Status: AC

## 2018-08-16 NOTE — Progress Notes (Signed)
  GYNECOLOGY PROGRESS NOTE  History:  52 y.o. G2P1103 presents to [facility] office today for problem gyn visit. She is s/p complete hysterectomy in 2017.  She reports she had intercourse with her husband 2 days ago and had not had sex in ~ 3 years. She then found out her husband cheated on her recently so she is concerned about STDs.   She has some vaginal itching but thinks she has a yeast infection after a course of antibiotics for cystic acne.   She has not taken her blood pressure medication today. She does have a primary care provider who manages her HTN. She denies h/a, chest pain, dizziness, shortness of breath, n/v, or fever/chills.    The following portions of the patient's history were reviewed and updated as appropriate: allergies, current medications, past family history, past medical history, past social history, past surgical history and problem list.   Review of Systems:  Pertinent items are noted in HPI.   Objective:  Physical Exam Blood pressure (!) 174/90, pulse 99, resp. rate 18, height 5\' 4"  (1.626 m), weight 75 kg, last menstrual period 08/19/2015. VS reviewed, nursing note reviewed,  Constitutional: well developed, well nourished, no distress HEENT: normocephalic CV: normal rate Pulm/chest wall: normal effort Breast Exam: deferred Abdomen: soft Neuro: alert and oriented x 3 Skin: warm, dry Psych: affect normal Pelvic exam: Cervix pink, visually closed, without lesion, scant white creamy discharge, vaginal walls and external genitalia normal Bimanual exam: Cervix 0/long/high, firm, anterior, neg CMT, uterus nontender, nonenlarged, adnexa without tenderness, enlargement, or mass  Assessment & Plan:  1. Possible exposure to STD --Testing today as baseline.  Recommend repeat testing in 3 months at annual exam.  --Offered treatment but pt declines, wants retesting to see if exposed.  - Cervicovaginal ancillary only( [place]) - Urine Culture - HepB+HepC+HIV Panel -  RPR - HIV Antibody (routine testing w rflx) - HSV Type I/II IgG, IgMw/ reflex  2. Vaginal candidiasis  - fluconazole (DIFLUCAN) 150 MG tablet; Take 1 tablet (150 mg total) by mouth once for 1 dose.  Dispense: 1 tablet; Refill: 3   Fatima Blank, CNM 3:33 PM

## 2018-08-17 LAB — HSV TYPE I/II IGG, IGMW/ REFLEX
HSV 1 Glycoprotein G Ab, IgG: <0.91 index (ref 0.00–0.90)
HSV 1 IgM: <1:10 {titer}
HSV 2 IgG, Type Spec: <0.91 index (ref 0.00–0.90)
HSV 2 IgM: <1:10 {titer}

## 2018-08-17 LAB — HEPB+HEPC+HIV PANEL
HEP B E AB: NEGATIVE
HEP B E AG: NEGATIVE
HEP B SURFACE AB, QUAL: NONREACTIVE
HEP C VIRUS AB: 0.1 {s_co_ratio} (ref 0.0–0.9)
HIV Screen 4th Generation wRfx: NONREACTIVE
Hep B C IgM: NEGATIVE
Hep B Core Total Ab: NEGATIVE
Hepatitis B Surface Ag: NEGATIVE

## 2018-08-17 LAB — RPR: RPR: NONREACTIVE

## 2018-08-18 LAB — URINE CULTURE

## 2018-08-20 LAB — CERVICOVAGINAL ANCILLARY ONLY
Bacterial vaginitis: NEGATIVE
CANDIDA VAGINITIS: POSITIVE — AB
Chlamydia: NEGATIVE
Neisseria Gonorrhea: NEGATIVE
Trichomonas: NEGATIVE

## 2018-09-03 ENCOUNTER — Ambulatory Visit: Payer: 59 | Admitting: Obstetrics & Gynecology

## 2018-10-05 ENCOUNTER — Other Ambulatory Visit: Payer: Self-pay | Admitting: Obstetrics

## 2018-10-05 DIAGNOSIS — Z1231 Encounter for screening mammogram for malignant neoplasm of breast: Secondary | ICD-10-CM

## 2018-10-07 ENCOUNTER — Ambulatory Visit
Admission: RE | Admit: 2018-10-07 | Discharge: 2018-10-07 | Disposition: A | Discharge status: Home / Self Care | Payer: 59 | Source: Ambulatory Visit | Attending: Obstetrics | Admitting: Obstetrics

## 2018-10-07 ENCOUNTER — Ambulatory Visit: Payer: 59

## 2018-10-07 DIAGNOSIS — Z1231 Encounter for screening mammogram for malignant neoplasm of breast: Secondary | ICD-10-CM

## 2019-09-14 IMAGING — MG DIGITAL SCREENING BILATERAL MAMMOGRAM WITH TOMO AND CAD
8 series · 9 of 24 positions shown · non-contrast
Comparison: Previous exam(s).

CLINICAL DATA: Screening.

EXAM:
DIGITAL SCREENING BILATERAL MAMMOGRAM WITH TOMO AND CAD

[R CC synth-2D]
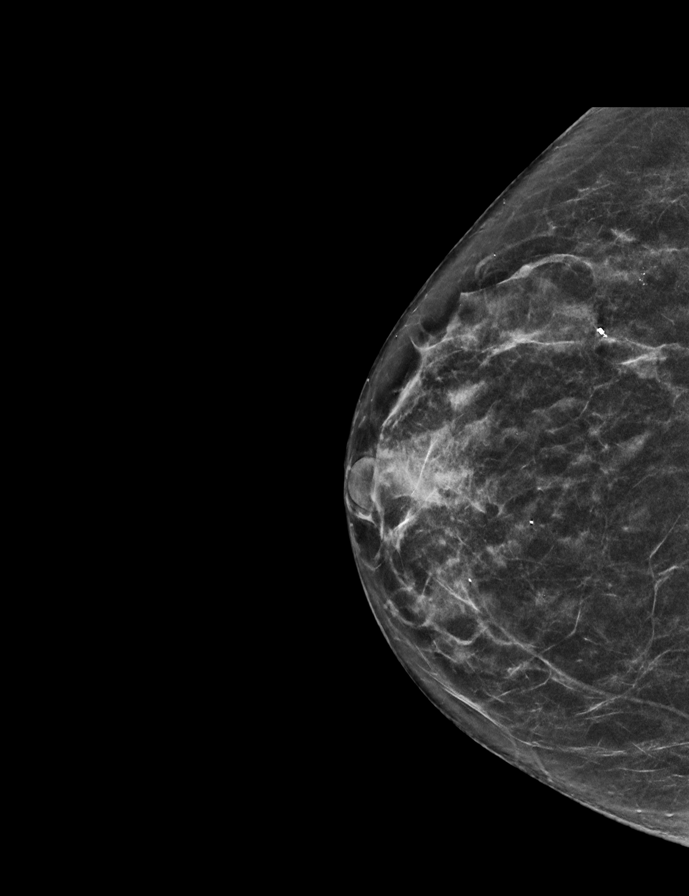

[L CC synth-2D]
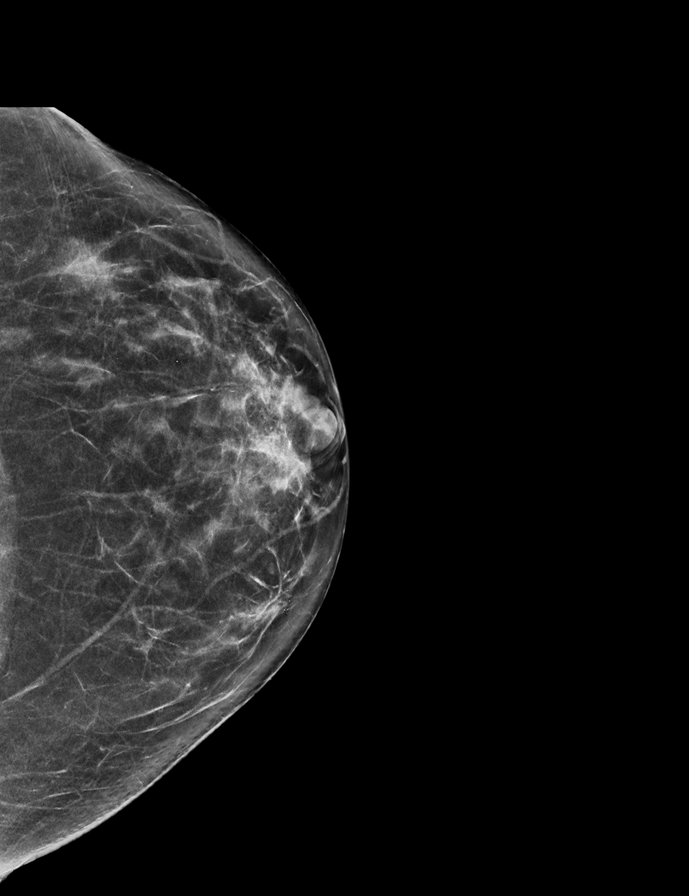

[R MLO synth-2D]
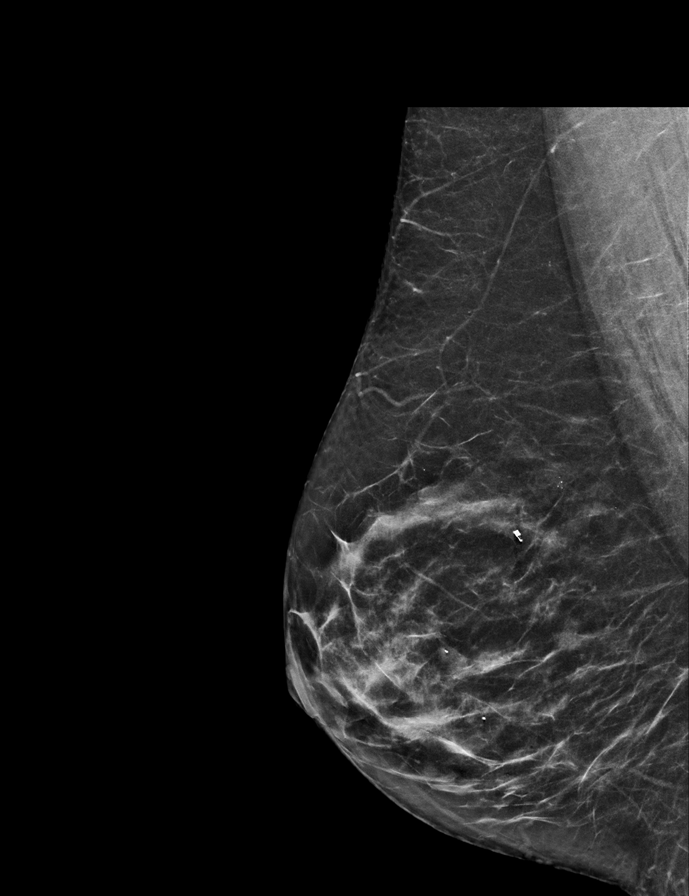

[L MLO synth-2D]
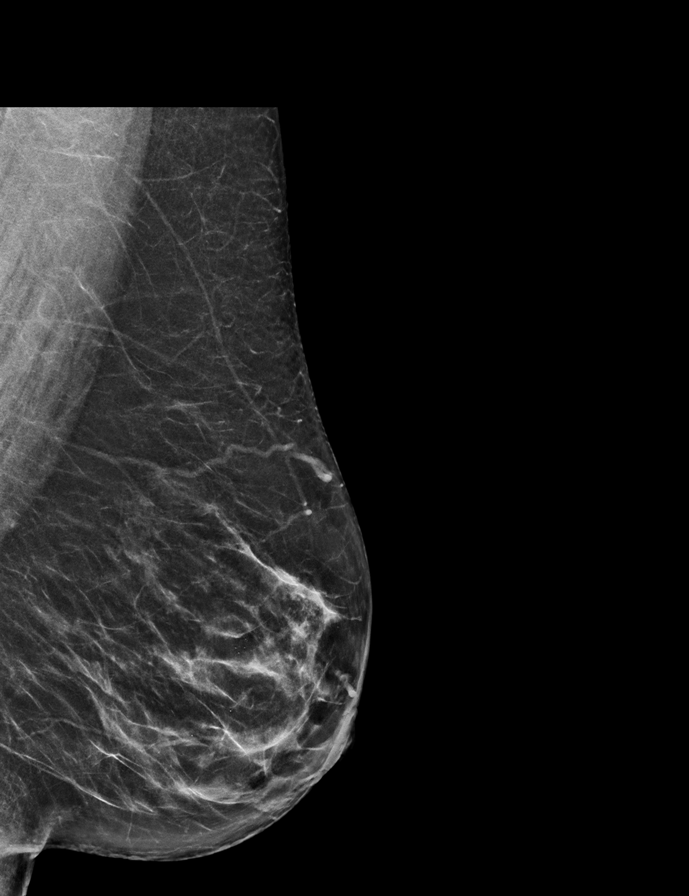

[L MLO tomo · 2 of 64 frames shown]
[frame 21/64]
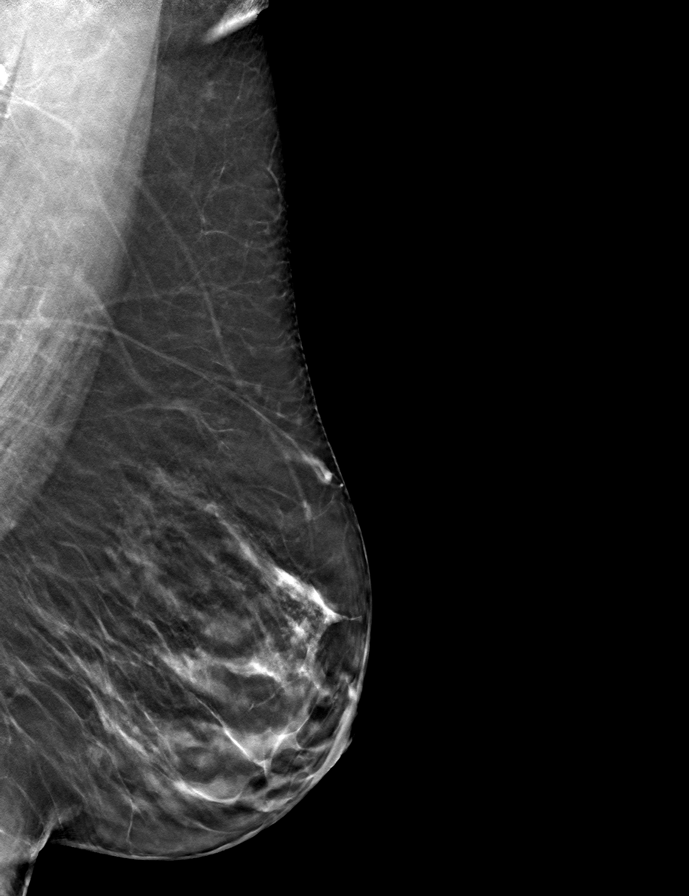
[frame 33/64]
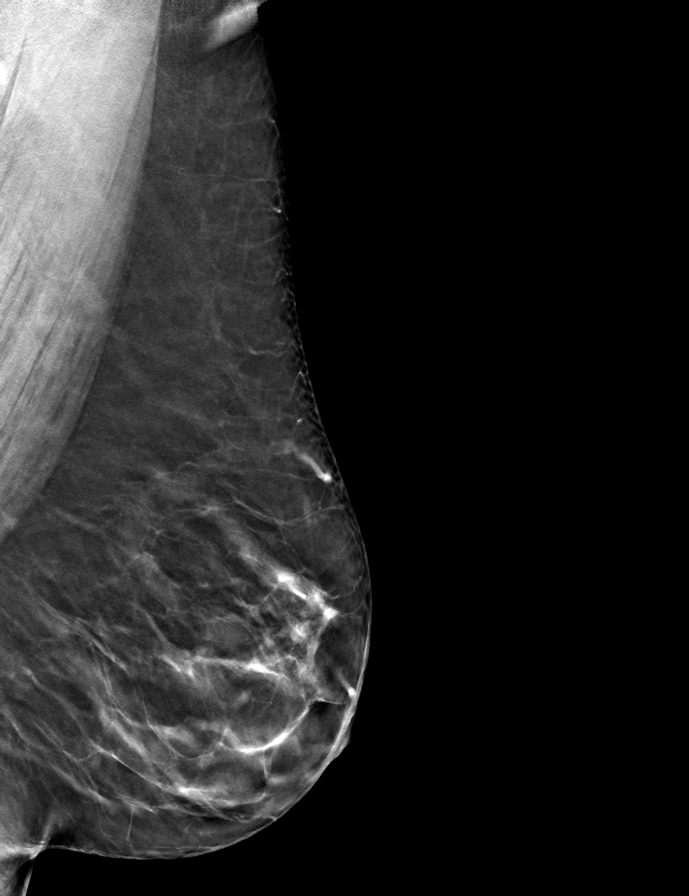

[L CC tomo · tomo slice 31/62.0]
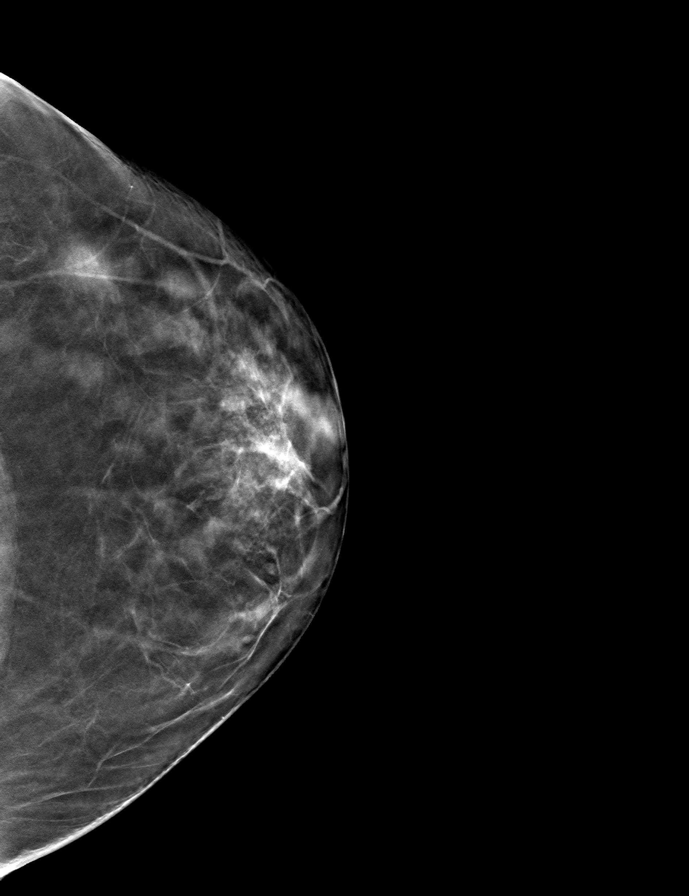

[R CC tomo · tomo slice 29/56.0]
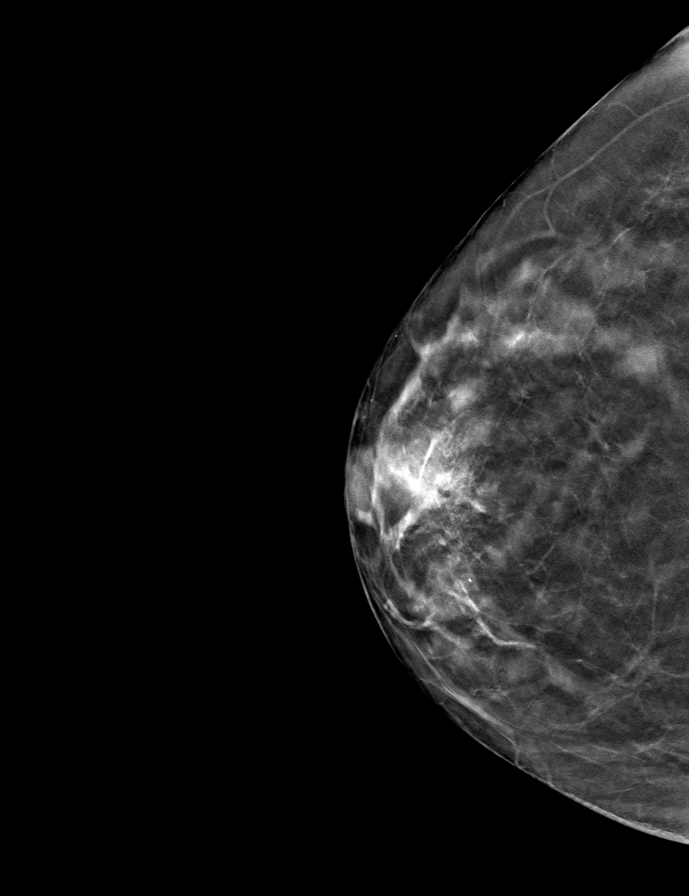

[R MLO tomo · tomo slice 31/61.0]
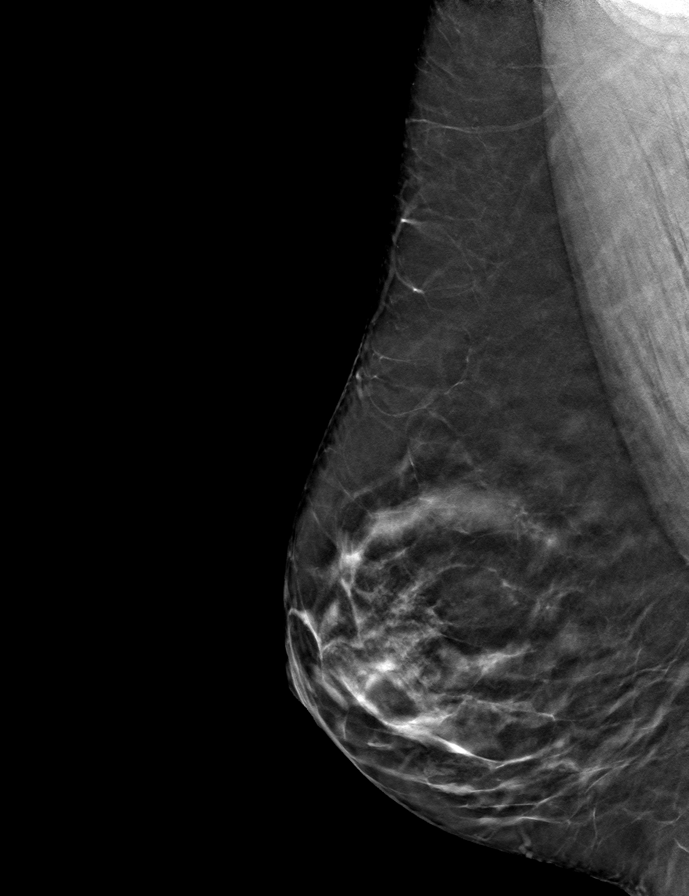

[9 of 24 positions shown; findings below may reference images not displayed]

ACR Breast Density Category c: The breast tissue is heterogeneously
dense, which may obscure small masses.
FINDINGS: There are no findings suspicious for malignancy. Images were
processed with CAD.
IMPRESSION: No mammographic evidence of malignancy. A result letter of this
screening mammogram will be mailed directly to the patient.

RECOMMENDATION:
Screening mammogram in one year. (Code:FT-U-LHB)

BI-RADS CATEGORY  1: Negative.

## 2020-08-24 ENCOUNTER — Other Ambulatory Visit: Payer: Self-pay | Admitting: Nurse Practitioner

## 2020-08-24 ENCOUNTER — Ambulatory Visit
Admission: RE | Admit: 2020-08-24 | Discharge: 2020-08-24 | Disposition: A | Discharge status: Home / Self Care | Payer: 59 | Source: Ambulatory Visit | Attending: Nurse Practitioner | Admitting: Nurse Practitioner

## 2020-08-24 DIAGNOSIS — R059 Cough, unspecified: Secondary | ICD-10-CM

## 2020-08-24 DIAGNOSIS — R509 Fever, unspecified: Secondary | ICD-10-CM

## 2021-08-01 IMAGING — CR DG CHEST 2V
2 series · 2 of 2 positions shown · non-contrast
Comparison: 10/02/2004 chest radiograph.

CLINICAL DATA: Productive cough, fever, COVID negative

EXAM:
CHEST - 2 VIEW

[w chest pa]
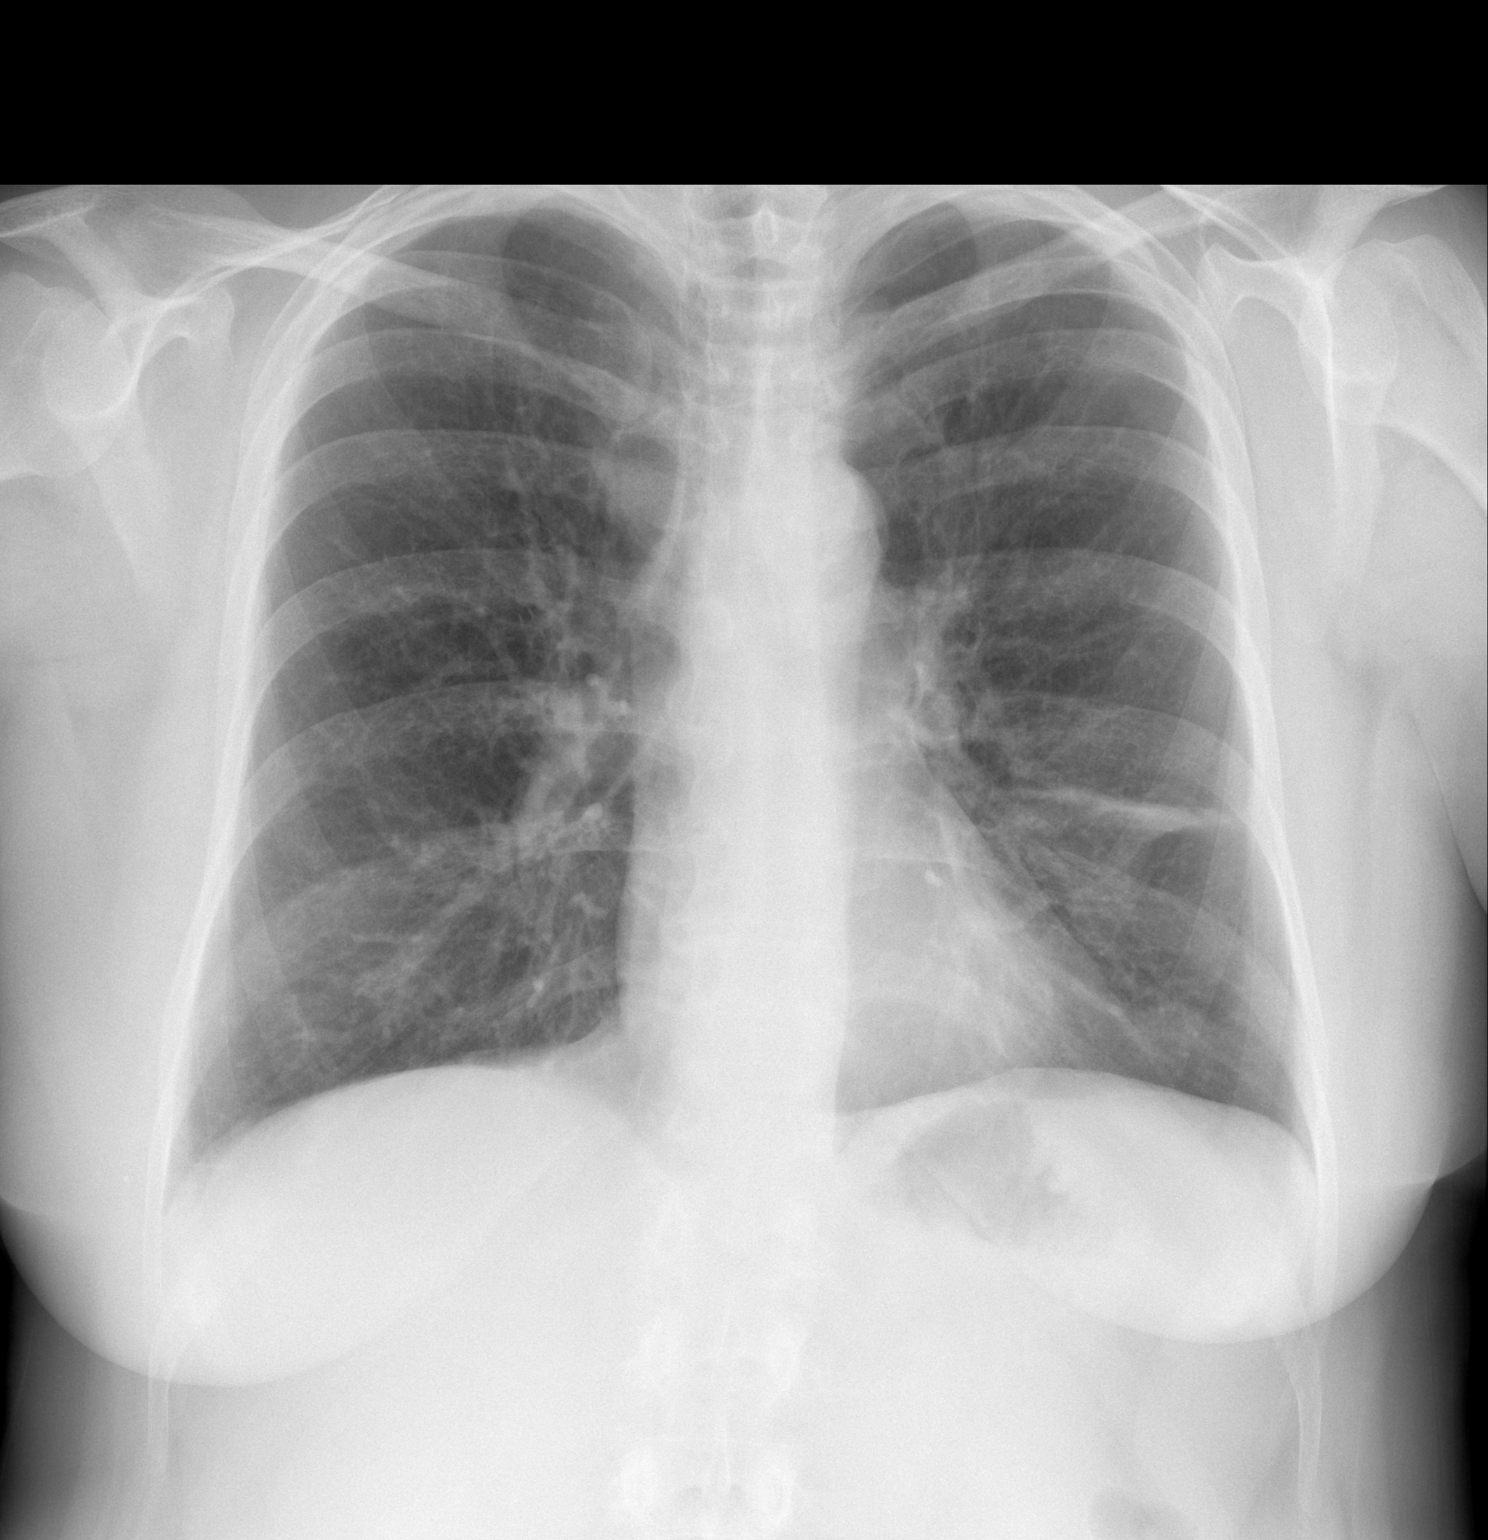

[w chest lat]
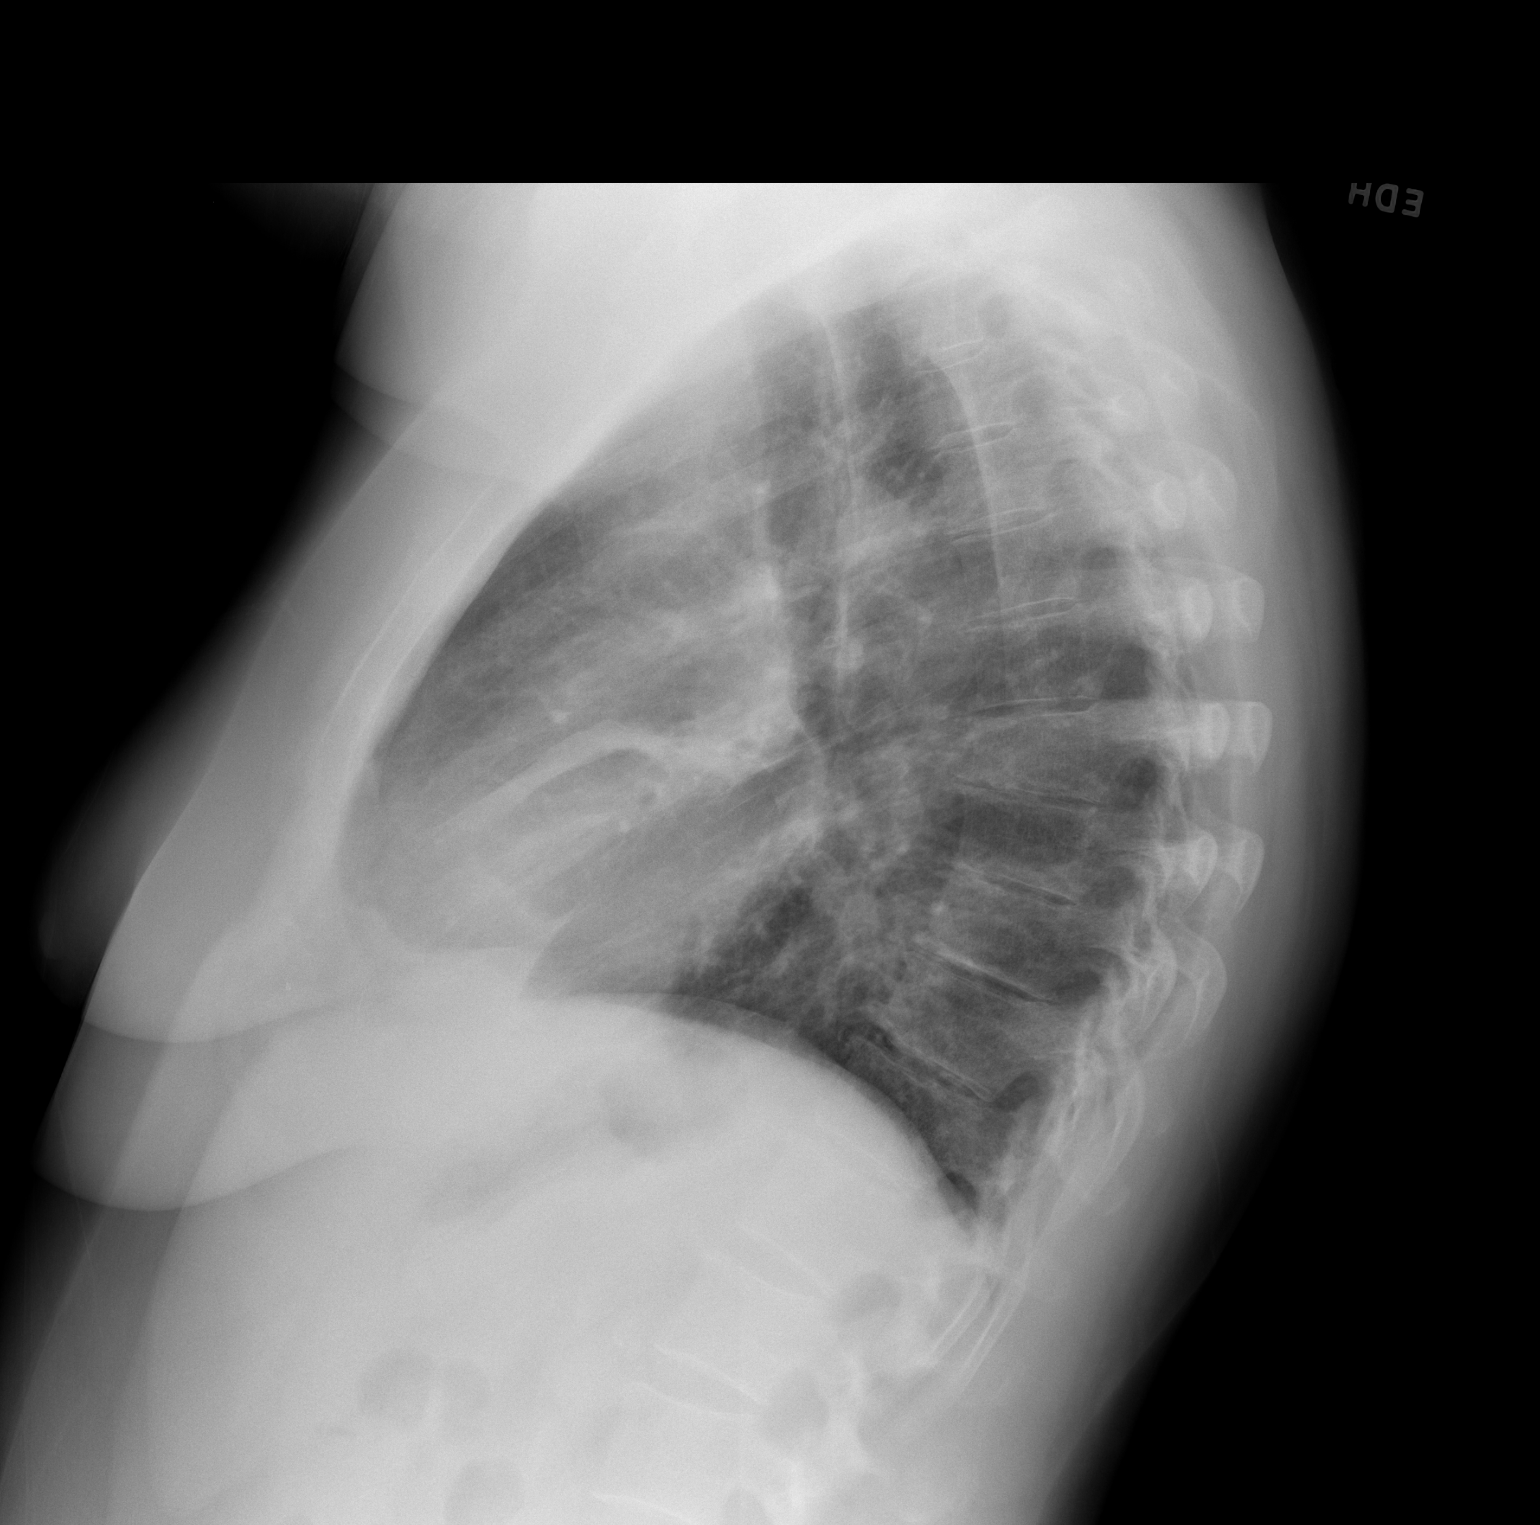

[2 of 2 positions shown; findings below may reference images not displayed]

FINDINGS: Stable cardiomediastinal silhouette with normal heart size. No
pneumothorax. No pleural effusion. Somewhat platelike consolidation
in the lingula. Otherwise clear lungs. No pulmonary edema.
IMPRESSION: Somewhat platelike consolidation in the lingula, compatible with
pneumonia. Recommend follow-up PA and lateral post treatment chest
radiographs in 4-6 weeks.

## 2022-08-14 ENCOUNTER — Other Ambulatory Visit: Payer: Self-pay | Admitting: Obstetrics

## 2022-08-14 ENCOUNTER — Other Ambulatory Visit (HOSPITAL_COMMUNITY)
Admission: RE | Admit: 2022-08-14 | Discharge: 2022-08-14 | Disposition: A | Discharge status: Home / Self Care | Payer: Managed Care, Other (non HMO) | Source: Ambulatory Visit | Attending: Student | Admitting: Student

## 2022-08-14 ENCOUNTER — Ambulatory Visit (INDEPENDENT_AMBULATORY_CARE_PROVIDER_SITE_OTHER): Payer: Managed Care, Other (non HMO) | Admitting: Student

## 2022-08-14 ENCOUNTER — Encounter: Payer: Self-pay | Admitting: Student

## 2022-08-14 VITALS — BP 147/86 | HR 86 | Ht 62.0 in | Wt 158.0 lb

## 2022-08-14 DIAGNOSIS — Z7689 Persons encountering health services in other specified circumstances: Secondary | ICD-10-CM

## 2022-08-14 DIAGNOSIS — Z01419 Encounter for gynecological examination (general) (routine) without abnormal findings: Secondary | ICD-10-CM | POA: Diagnosis present

## 2022-08-14 DIAGNOSIS — Z1231 Encounter for screening mammogram for malignant neoplasm of breast: Secondary | ICD-10-CM

## 2022-08-14 DIAGNOSIS — N958 Other specified menopausal and perimenopausal disorders: Secondary | ICD-10-CM

## 2022-08-14 DIAGNOSIS — N368 Other specified disorders of urethra: Secondary | ICD-10-CM

## 2022-08-14 DIAGNOSIS — R300 Dysuria: Secondary | ICD-10-CM | POA: Diagnosis not present

## 2022-08-14 DIAGNOSIS — F0630 Mood disorder due to known physiological condition, unspecified: Secondary | ICD-10-CM

## 2022-08-14 DIAGNOSIS — Z780 Asymptomatic menopausal state: Secondary | ICD-10-CM | POA: Diagnosis not present

## 2022-08-14 MED ORDER — FLUOXETINE HCL 20 MG PO CAPS: 20.0000 mg | ORAL_CAPSULE | Freq: Every day | ORAL | 0 refills | Status: DC

## 2022-08-14 MED ORDER — PREMARIN 0.625 MG/GM VA CREA: 1.0000 | TOPICAL_CREAM | Freq: Every day | VAGINAL | 12 refills | Status: DC

## 2022-08-14 NOTE — Progress Notes (Signed)
Pt presents for annual and all STD testing. Pt reports vaginal discharge, vaginal irritation, night sweats, hematuria and dysuria.   Mammogram scheduled 09/24/22 Colonoscopy - Declines

## 2022-08-14 NOTE — Progress Notes (Signed)
ANNUAL EXAM Patient name: Jill Hanna MRN [id number]  Date of birth: 02-15-1966 Chief Complaint:   New Patient (Initial Visit)  History of Present Illness:   Jill Hanna is a 56 y.o. [phone] Caucasian female being seen today for a routine annual exam.  Current complaints:  vaginal discharge, vaginal irritation, night sweats, hematuria and dysuria.   Patient shares that she has noticed an occasional new onset of vaginal discharge and mild irritation. Night sweats have been consistent and more bothersome since menopause. Was told that she was not eligible for HRT due to hypertension that is not controlled. Patient is managed by PCP and does not take their recommended pills daily.Patient had hysterectomy in 2017 and has gradual experience worsening night sweats, mood changes, and lower pelvic pressure. Reports lifetime history of reoccurring UTIs. Consulted with urologist recently regarding blood in her urine and was told everything was reassuring.  Patient's last menstrual period was 08/19/2015.    The pregnancy intention screening data noted above was reviewed. Potential methods of contraception were discussed. The patient elected to proceed with No data recorded.   Last pap unknown. Results were: N/A.  Last mammogram: 2020, normal Last colonoscopy: unknown/never     08/14/2022    1:14 PM  Depression screen PHQ 2/9  Decreased Interest 1  Down, Depressed, Hopeless 1  PHQ - 2 Score 2  Altered sleeping 1  Tired, decreased energy 1  Change in appetite 0  Feeling bad or failure about yourself  1  Trouble concentrating 1  Moving slowly or fidgety/restless 0  Suicidal thoughts 0  PHQ-9 Score 6  Difficult doing work/chores Somewhat difficult        08/14/2022    1:16 PM  GAD 7 : Generalized Anxiety Score  Nervous, Anxious, on Edge 0  Control/stop worrying 0  Worry too much - different things 0  Trouble relaxing 0  Restless 0  Easily annoyed or irritable 0  Afraid -  awful might happen 0  Total GAD 7 Score 0  Anxiety Difficulty Not difficult at all     Review of Systems:   Pertinent items are noted in HPI Denies any headaches, blurred vision, fatigue, shortness of breath, chest pain, abdominal pain, abnormal vaginal discharge/itching/odor/irritation, problems with periods, bowel movements, urination, or intercourse unless otherwise stated above. Pertinent History Reviewed:  Reviewed past medical,surgical, social and family history.  Reviewed problem list, medications and allergies. Physical Assessment:   Vitals:   08/14/22 1311  BP: (!) 147/86  Pulse: 86  Weight: 158 lb (71.7 kg)  Height: '5\' 2"'$  (1.575 m)  Body mass index is 28.9 kg/m.        Physical Examination:   General appearance - well appearing, and in no distress  Mental status - alert, oriented to person, place, and time  Psych:  She has a normal mood and affect  Skin - warm and dry, normal color, no suspicious lesions noted  Chest - effort normal, all lung fields clear to auscultation bilaterally  Heart - normal rate and regular rhythm  Neck:  midline trachea, no thyromegaly or nodules  Breasts - breasts appear normal, no suspicious masses, no skin or nipple changes or  axillary nodes  Abdomen - soft, nontender, nondistended, no masses or organomegaly  Pelvic - VULVA: normal appearing vulva with no masses, tenderness or lesions  VAGINA: mild atrophy noted, otherwise normal appearing vagina with and no discharge or  lesions CERVIX: absent ADNEXA: nonpalpable   Extremities:  No swelling  or varicosities noted  Chaperone present for exam  No results found for this or any previous visit (from the past 24 hour(s)).  Assessment & Plan:  .1. Women's annual routine gynecological examination - Cervicovaginal ancillary only - Hepatitis B surface antigen - Hepatitis C antibody - HIV Antibody (routine testing w rflx) - RPR - POCT Urinalysis Dipstick - Hemoglobin A1c - TSH Rfx on  Abnormal to Free T4 - CBC  2. Encounter to establish care - Has not been to OBGYN in ~4-5 years  3. 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 Encouraged patient to seek follow-up with PCP for blood pressure management. Patient not interested in smoking cessation.  - FLUoxetine (PROZAC) 20 MG capsule; Take 1 capsule (20 mg total) by mouth daily.  Dispense: 45 capsule; Refill: 0. Patient to follow-up in 1 month to assess mood changes. Precautions provided for starting an antidepressant medication. Informed to notify healthcare provider for new onset or worsening headaches, nausea/vomiting, or worsening sadness or thoughts of self-harm - conjugated estrogens (PREMARIN) vaginal cream; Place 1 Applicatorful vaginally daily. And pea size amount to urethra (opening for urine passage). This is to help with postmenopausal tissue thinning  Dispense: 42.5 g; Refill: 12   Labs/procedures today:   Mammogram:  Scheduled for 09/24/22 , or sooner if problems Colonoscopy:  declined , or sooner if problems  Orders Placed This Encounter  Procedures   Hepatitis B surface antigen   Hepatitis C antibody   HIV Antibody (routine testing w rflx)   RPR   Hemoglobin A1c   TSH Rfx on Abnormal to Free T4   CBC   POCT Urinalysis Dipstick    Meds:  Meds ordered this encounter  Medications   FLUoxetine (PROZAC) 20 MG capsule    Sig: Take 1 capsule (20 mg total) by mouth daily.    Dispense:  45 capsule    Refill:  0    Order Specific Question:   Supervising Provider    Answer:   Donnamae Jude [[id number]]   conjugated estrogens (PREMARIN) vaginal cream    Sig: Place 1 Applicatorful vaginally daily. And pea size amount to urethra (opening for urine passage). This is to help with postmenopausal tissue thinning     Dispense:  42.5 g    Refill:  12    Order Specific Question:   Supervising Provider    Answer:   Donnamae Jude [[id number]]    Follow-up: Return in about 1 month (around 09/14/2022), or if symptoms worsen or fail to improve, for IN-PERSON.  Johnston Ebbs, NP

## 2022-08-15 LAB — TSH RFX ON ABNORMAL TO FREE T4: TSH: 1.31 u[IU]/mL (ref 0.450–4.500)

## 2022-08-15 LAB — CERVICOVAGINAL ANCILLARY ONLY
Bacterial Vaginitis (gardnerella): NEGATIVE
Candida Glabrata: NEGATIVE
Candida Vaginitis: NEGATIVE
Chlamydia: NEGATIVE
Comment: NEGATIVE
Comment: NEGATIVE
Comment: NEGATIVE
Comment: NEGATIVE
Comment: NEGATIVE
Comment: NORMAL
Neisseria Gonorrhea: NEGATIVE
Trichomonas: NEGATIVE

## 2022-08-15 LAB — CBC
Hematocrit: 45.9 % (ref 34.0–46.6)
Hemoglobin: 15.5 g/dL (ref 11.1–15.9)
MCH: 29.3 pg (ref 26.6–33.0)
MCHC: 33.8 g/dL (ref 31.5–35.7)
MCV: 87 fL (ref 79–97)
Platelets: 230 10*3/uL (ref 150–450)
RBC: 5.29 x10E6/uL — ABNORMAL HIGH (ref 3.77–5.28)
RDW: 13 % (ref 11.7–15.4)
WBC: 7.1 10*3/uL (ref 3.4–10.8)

## 2022-08-15 LAB — POCT URINALYSIS DIPSTICK
Bilirubin, UA: NEGATIVE
Glucose, UA: NEGATIVE
Ketones, UA: NEGATIVE
Leukocytes, UA: NEGATIVE
Nitrite, UA: NEGATIVE
Protein, UA: NEGATIVE
Spec Grav, UA: 1.01 (ref 1.010–1.025)
Urobilinogen, UA: 0.2 E.U./dL
pH, UA: 6 (ref 5.0–8.0)

## 2022-08-15 LAB — HEMOGLOBIN A1C
Est. average glucose Bld gHb Est-mCnc: 131 mg/dL
Hgb A1c MFr Bld: 6.2 % — ABNORMAL HIGH (ref 4.8–5.6)

## 2022-08-15 LAB — HEPATITIS C ANTIBODY: Hep C Virus Ab: NONREACTIVE

## 2022-08-15 LAB — HEPATITIS B SURFACE ANTIGEN: Hepatitis B Surface Ag: NEGATIVE

## 2022-08-15 LAB — HIV ANTIBODY (ROUTINE TESTING W REFLEX): HIV Screen 4th Generation wRfx: NONREACTIVE

## 2022-08-15 LAB — RPR: RPR Ser Ql: NONREACTIVE

## 2022-09-24 ENCOUNTER — Ambulatory Visit
Admission: RE | Admit: 2022-09-24 | Discharge: 2022-09-24 | Disposition: A | Discharge status: Home / Self Care | Payer: Managed Care, Other (non HMO) | Source: Ambulatory Visit | Attending: Obstetrics | Admitting: Obstetrics

## 2022-09-24 DIAGNOSIS — Z1231 Encounter for screening mammogram for malignant neoplasm of breast: Secondary | ICD-10-CM

## 2023-07-23 ENCOUNTER — Encounter: Payer: Self-pay | Admitting: Gastroenterology

## 2023-10-06 ENCOUNTER — Ambulatory Visit: Payer: 59 | Admitting: Gastroenterology

## 2023-10-06 ENCOUNTER — Encounter: Payer: Self-pay | Admitting: Gastroenterology

## 2023-10-06 VITALS — BP 124/64 | HR 78 | Ht 63.5 in | Wt 128.0 lb

## 2023-10-06 DIAGNOSIS — R195 Other fecal abnormalities: Secondary | ICD-10-CM

## 2023-10-06 DIAGNOSIS — K4090 Unilateral inguinal hernia, without obstruction or gangrene, not specified as recurrent: Secondary | ICD-10-CM | POA: Diagnosis not present

## 2023-10-06 DIAGNOSIS — R634 Abnormal weight loss: Secondary | ICD-10-CM

## 2023-10-06 MED ORDER — SUFLAVE 178.7 G PO SOLR: 1.0000 | Freq: Once | ORAL | 0 refills | Status: AC

## 2023-10-06 NOTE — Patient Instructions (Addendum)
You have been scheduled for a colonoscopy. Please follow written instructions given to you at your visit today.   If you use inhalers (even only as needed), please bring them with you on the day of your procedure. ______________________________________________________  Bonita Quin will receive your bowel preparation through [facility], which ensures the lowest copay and home delivery, with outreach via text or call from an [phone] number. Please respond promptly to avoid rescheduling of your procedure. If you are interested in alternative options or have any questions regarding your prep, please contact them at [phone] ____________________________________________________________________________  Your Provider Has Sent Your Bowel Prep Regimen To [facility]   [facility] will contact you to verify your information and collect your copay, if applicable. Enjoy the comfort of your home while your prescription is mailed to you, FREE of any shipping charges.   [facility] accepts all major insurance benefits and applies discounts & coupons.  Have additional questions?   Chat: [email or web address] Call: [phone] Email: [email or web address] NCPDP: [id number]  How will [facility] contact you?  With a Welcome phone call,  a Welcome text and a checkout link in text form.  Texts you receive from [phone] Are NOT Spam.  *To set up delivery, you must complete the checkout process via link or speak to one of the patient care representatives. If [facility] is unable to reach you, your prescription may be delayed.  To avoid long hold times on the phone, you may also utilize the secure chat feature on the [facility] website to request that they call you back for transaction completion or to expedite your concerns.  We have sent over your referral to [facility]. You may receive a call within the next few weeks regarding an appointment.  Due to recent changes in healthcare laws, you  may see the results of your imaging and laboratory studies on MyChart before your provider has had a chance to review them.  We understand that in some cases there may be results that are confusing or concerning to you. Not all laboratory results come back in the same time frame and the provider may be waiting for multiple results in order to interpret others.  Please give [place] 48 hours in order for your provider to thoroughly review all the results before contacting the office for clarification of your results.    Thank you for trusting me with your gastrointestinal care!   Boone Master, PA

## 2023-10-06 NOTE — Progress Notes (Signed)
Chief Complaint: positive hemosure Primary GI MD: Gentry Fitz  HPI: 58 year old female with medical history as listed below presents for evaluation of positive hemosure.  Patient referred here by PCP for positive Hemosure.  Was seen by PCP November 2024 and at that time was having some constipation that resolved with fiber supplement.  Also was having hair loss and weight loss.  Reported 20 pound weight loss over the course of 2024 secondary to the keto diet.  She stopped the keto diet 6 months ago but continued to lose weight.  Lab work 06/2023 Normal CMP CBC without anemia infection  Today, patient reports no GI issues.  Overall she is doing well.  She states last year at the beginning of the year she weighed 168 and today she was 128.  This was mostly intentional weight loss has been told her her A1c was elevated.  No previous colonoscopy.  No family history of colon cancer.  She is concerned about an inguinal hernia in her right side.  She notices when she walks around at work, towards the end of the day and starts becoming painful.  She has been diagnosed with a hernia in the past.     Past Medical History:  Diagnosis Date   Hypertension    Prediabetes     Past Surgical History:  Procedure Laterality Date   ABDOMINAL HYSTERECTOMY     has ovaries   TUBAL LIGATION      Current Outpatient Medications  Medication Sig Dispense Refill   amLODipine-valsartan (EXFORGE) 10-160 MG tablet Take 1 tablet by mouth daily with breakfast.     ascorbic acid (VITAMIN C) 500 MG tablet Take 500 mg by mouth 2 (two) times daily.     Cholecalciferol (VITAMIN D3) 5000 units CAPS Take 1 capsule by mouth daily with breakfast.     metFORMIN (GLUCOPHAGE) 1000 MG tablet Take 1,000 mg by mouth 2 (two) times daily.     Multiple Vitamin (MULTIVITAMIN WITH MINERALS) TABS tablet Take 1 tablet by mouth daily.     Omega-3 Fatty Acids (FISH OIL PO) Take by mouth. Twice a week     SUFLAVE 178.7 g SOLR Take  1 kit by mouth once for 1 dose. 1 each 0   No current facility-administered medications for this visit.    Allergies as of 10/06/2023 - Review Complete 10/06/2023  Allergen Reaction Noted   Penicillins Hives 05/20/2013    Family History  Problem Relation Age of Onset   Diabetes Mother    Cancer Maternal Grandmother        lukemia    Social History   Socioeconomic History   Marital status: Married    Spouse name: Not on file   Number of children: 2   Years of education: Not on file   Highest education level: Not on file  Occupational History   Occupation: [employer] in [place]  Tobacco Use   Smoking status: Every Day    Current packs/day: 0.00    Types: Cigarettes    Passive exposure: Current   Smokeless tobacco: Never  Vaping Use   Vaping status: Never Used  Substance and Sexual Activity   Alcohol use: Yes    Comment: weekends   Drug use: No   Sexual activity: Not Currently    Partners: Male    Birth control/protection: Surgical  Other Topics Concern   Not on file  Social History Narrative   Not on file   Social Drivers of Health  Financial Resource Strain: Not on file  Food Insecurity: Not on file  Transportation Needs: Not on file  Physical Activity: Not on file  Stress: Not on file  Social Connections: Not on file  Intimate Partner Violence: Not on file    Review of Systems:    Constitutional: No weight loss, fever, chills, weakness or fatigue HEENT: Eyes: No change in vision               Ears, Nose, Throat:  No change in hearing or congestion Skin: No rash or itching Cardiovascular: No chest pain, chest pressure or palpitations   Respiratory: No SOB or cough Gastrointestinal: See HPI and otherwise negative Genitourinary: No dysuria or change in urinary frequency Neurological: No headache, dizziness or syncope Musculoskeletal: No new muscle or joint pain Hematologic: No bleeding or bruising Psychiatric: No history of depression or  anxiety    Physical Exam:  Vital signs: BP 124/64   Pulse 78   Ht 5' 3.5" (1.613 m)   Wt 128 lb (58.1 kg)   LMP 08/19/2015   BMI 22.32 kg/m   Constitutional: NAD, Well developed, Well nourished, alert and cooperative Head:  Normocephalic and atraumatic. Eyes:   PEERL, EOMI. No icterus. Conjunctiva pink. Respiratory: Respirations even and unlabored. Lungs clear to auscultation bilaterally.   No wheezes, crackles, or rhonchi.  Cardiovascular:  Regular rate and rhythm. No peripheral edema, cyanosis or pallor.  Gastrointestinal:  Soft, nondistended, nontender. No rebound or guarding. Normal bowel sounds. No appreciable masses or hepatomegaly.  Right inguinal hernia noted, nontender Rectal:  Not performed.  Msk:  Symmetrical without gross deformities. Without edema, no deformity or joint abnormality.  Neurologic:  Alert and  oriented x4;  grossly normal neurologically.  Skin:   Dry and intact without significant lesions or rashes. Psychiatric: Oriented to person, place and time. Demonstrates good judgement and reason without abnormal affect or behaviors.   RELEVANT LABS AND IMAGING: CBC    Component Value Date/Time   WBC 7.1 08/14/2022 1406   WBC 9.5 03/03/2016 1157   RBC 5.29 (H) 08/14/2022 1406   RBC 5.23 (H) 03/03/2016 1157   HGB 15.5 08/14/2022 1406   HCT 45.9 08/14/2022 1406   PLT 230 08/14/2022 1406   MCV 87 08/14/2022 1406   MCH 29.3 08/14/2022 1406   MCH 29.8 03/03/2016 1157   MCHC 33.8 08/14/2022 1406   MCHC 34.2 03/03/2016 1157   RDW 13.0 08/14/2022 1406   LYMPHSABS 1.9 09/01/2017 1151   MONOABS 0.7 09/29/2014 1433   EOSABS 0.2 09/01/2017 1151   BASOSABS 0.0 09/01/2017 1151    CMP     Component Value Date/Time   NA 141 09/01/2017 1151   K 3.9 09/01/2017 1151   CL 104 09/01/2017 1151   CO2 23 09/01/2017 1151   GLUCOSE 177 (H) 09/01/2017 1151   GLUCOSE 99 03/03/2016 1157   BUN 15 09/01/2017 1151   CREATININE 0.73 09/01/2017 1151   CALCIUM 9.5 09/01/2017  1151   PROT 6.6 09/01/2017 1151   ALBUMIN 4.2 09/01/2017 1151   AST 42 (H) 09/01/2017 1151   ALT 71 (H) 09/01/2017 1151   ALKPHOS 76 09/01/2017 1151   BILITOT 0.3 09/01/2017 1151   GFRNONAA 96 09/01/2017 1151   GFRAA 110 09/01/2017 1151     Assessment/Plan:   Positive FIT No previous colonoscopy.  No GI issues.  She is lost 40 pounds over the last year intentionally due to an elevated A1c through the keto diet.  Previous constipation resolved with  fiber.  No anemia - Schedule colonoscopy -- I thoroughly discussed the procedure with the patient (at bedside) to include nature of the procedure, alternatives, benefits, and risks (including but not limited to bleeding, infection, perforation, anesthesia/cardiac pulmonary complications).  Patient verbalized understanding and gave verbal consent to proceed with procedure.   Inguinal hernia Right inguinal hernia on physical exam, reducible, nontender.  This is bothersome to patient she would like further evaluation - Refer to [facility].  Weight loss 168 to 128lbs over one year through keto diet. Has been stable  Wt Readings from Last 3 Encounters:  10/06/23 128 lb (58.1 kg)  08/14/22 158 lb (71.7 kg)  08/16/18 165 lb 6.4 oz (75 kg)     Jenniffer Vessels [facility] 10/06/2023, 12:19 PM  Cc: Lance Bosch, NP

## 2023-10-28 ENCOUNTER — Other Ambulatory Visit: Payer: Self-pay | Admitting: General Surgery

## 2023-10-28 DIAGNOSIS — R1031 Right lower quadrant pain: Secondary | ICD-10-CM

## 2023-11-02 ENCOUNTER — Encounter: Payer: Self-pay | Admitting: Gastroenterology

## 2023-11-04 ENCOUNTER — Ambulatory Visit
Admission: RE | Admit: 2023-11-04 | Discharge: 2023-11-04 | Disposition: A | Discharge status: Home / Self Care | Source: Ambulatory Visit | Attending: General Surgery

## 2023-11-04 DIAGNOSIS — R1031 Right lower quadrant pain: Secondary | ICD-10-CM

## 2023-11-09 ENCOUNTER — Encounter: Payer: Self-pay | Admitting: Gastroenterology

## 2023-11-09 ENCOUNTER — Ambulatory Visit (AMBULATORY_SURGERY_CENTER): Payer: 59 | Admitting: Gastroenterology

## 2023-11-09 VITALS — BP 97/67 | HR 65 | Temp 98.0°F | Resp 17 | Ht 63.0 in | Wt 128.0 lb

## 2023-11-09 DIAGNOSIS — K644 Residual hemorrhoidal skin tags: Secondary | ICD-10-CM | POA: Diagnosis not present

## 2023-11-09 DIAGNOSIS — K633 Ulcer of intestine: Secondary | ICD-10-CM | POA: Diagnosis not present

## 2023-11-09 DIAGNOSIS — Z1211 Encounter for screening for malignant neoplasm of colon: Secondary | ICD-10-CM | POA: Diagnosis present

## 2023-11-09 DIAGNOSIS — K648 Other hemorrhoids: Secondary | ICD-10-CM | POA: Diagnosis not present

## 2023-11-09 DIAGNOSIS — K5730 Diverticulosis of large intestine without perforation or abscess without bleeding: Secondary | ICD-10-CM | POA: Diagnosis not present

## 2023-11-09 DIAGNOSIS — R195 Other fecal abnormalities: Secondary | ICD-10-CM

## 2023-11-09 DIAGNOSIS — K529 Noninfective gastroenteritis and colitis, unspecified: Secondary | ICD-10-CM

## 2023-11-09 DIAGNOSIS — C801 Malignant (primary) neoplasm, unspecified: Secondary | ICD-10-CM | POA: Insufficient documentation

## 2023-11-09 MED ORDER — SODIUM CHLORIDE 0.9 % IV SOLN: 500.0000 mL | Freq: Once | INTRAVENOUS | Status: DC

## 2023-11-09 NOTE — Progress Notes (Signed)
 Patient to call to schedule appointment, needs to look at calendar.

## 2023-11-09 NOTE — Progress Notes (Signed)
 To pacu, VSS. Report to [email or web address]

## 2023-11-09 NOTE — Op Note (Signed)
 [facility] Patient Name: Jill Hanna Procedure Date: 11/09/2023 2:24 PM MRN: [id number] Endoscopist: Napoleon Form , MD, [id number] Age: 58 Referring MD:  Date of Birth: 03-06-1966 Gender: Female Account #: [id number] Procedure:                Colonoscopy Indications:              Positive Cologuard test Medicines:                Monitored Anesthesia Care Procedure:                Pre-Anesthesia Assessment:                           - Prior to the procedure, a History and Physical                            was performed, and patient medications and                            allergies were reviewed. The patient's tolerance of                            previous anesthesia was also reviewed. The risks                            and benefits of the procedure and the sedation                            options and risks were discussed with the patient.                            All questions were answered, and informed consent                            was obtained. Prior Anticoagulants: The patient has                            taken no anticoagulant or antiplatelet agents. ASA                            Grade Assessment: II - A patient with mild systemic                            disease. After reviewing the risks and benefits,                            the patient was deemed in satisfactory condition to                            undergo the procedure.                           After obtaining informed consent, the colonoscope  was passed under direct vision. Throughout the                            procedure, the patient's blood pressure, pulse, and                            oxygen saturations were monitored continuously. The                            [id number]-HQ190L Colonoscope [id number] was introduced                            through the anus and advanced to the the cecum,                            identified by appendiceal orifice and  ileocecal                            valve. The colonoscopy was performed without                            difficulty. The patient tolerated the procedure                            well. The quality of the bowel preparation was                            good. The ileocecal valve, appendiceal orifice, and                            rectum were photographed. Scope In: 2:31:09 PM Scope Out: 2:47:09 PM Scope Withdrawal Time: 0 hours 9 minutes 24 seconds  Total Procedure Duration: 0 hours 16 minutes 0 seconds  Findings:                 The perianal and digital rectal examinations were                            normal.                           Discontinuous areas of nonbleeding ulcerated mucosa                            with stigmata of recent bleeding were present in                            the ascending colon. Biopsies were taken with a                            cold forceps for histology.                           Scattered large-mouthed, medium-mouthed and  small-mouthed diverticula were found in the sigmoid                            colon and descending colon.                           Non-bleeding external and internal hemorrhoids were                            found during retroflexion. The hemorrhoids were                            medium-sized. Complications:            No immediate complications. Estimated Blood Loss:     Estimated blood loss was minimal. Impression:               - Mucosal ulceration in the ascending colon.                            Biopsied.                           - Moderate diverticulosis in the sigmoid colon and                            in the descending colon.                           - Non-bleeding external and internal hemorrhoids. Recommendation:           - Patient has a contact number available for                            emergencies. The signs and symptoms of potential                            delayed  complications were discussed with the                            patient. Return to normal activities tomorrow.                            Written discharge instructions were provided to the                            patient.                           - Resume previous diet.                           - Continue present medications.                           - Await pathology results.                           -  No high dose aspirin, ibuprofen, naproxen, or                            other non-steroidal anti-inflammatory drugs.                           - Return to GI office at the next available                            appointment. Napoleon Form, MD 11/09/2023 3:02:13 PM This report has been signed electronically.

## 2023-11-09 NOTE — Patient Instructions (Addendum)
 Resume previous diet.                           - Continue present medications.                           - Await pathology results.                           - No high dose aspirin, ibuprofen, naproxen, or                            other non-steroidal anti-inflammatory drugs.                           - Return to GI office at the next available                            appointment. Handout on diverticulosis given.     YOU HAD AN ENDOSCOPIC PROCEDURE TODAY AT THE [facility]:   Refer to the procedure report that was given to you for any specific questions about what was found during the examination.  If the procedure report does not answer your questions, please call your gastroenterologist to clarify.  If you requested that your care partner not be given the details of your procedure findings, then the procedure report has been included in a sealed envelope for you to review at your convenience later.  YOU SHOULD EXPECT: Some feelings of bloating in the abdomen. Passage of more gas than usual.  Walking can help get rid of the air that was put into your GI tract during the procedure and reduce the bloating. If you had a lower endoscopy (such as a colonoscopy or flexible sigmoidoscopy) you may notice spotting of blood in your stool or on the toilet paper. If you underwent a bowel prep for your procedure, you may not have a normal bowel movement for a few days.  Please Note:  You might notice some irritation and congestion in your nose or some drainage.  This is from the oxygen used during your procedure.  There is no need for concern and it should clear up in a day or so.  SYMPTOMS TO REPORT IMMEDIATELY:  Following lower endoscopy (colonoscopy or flexible sigmoidoscopy):  Excessive amounts of blood in the stool  Significant tenderness or worsening of abdominal pains  Swelling of the abdomen that is new, acute  Fever of 100F or higher   For urgent or emergent issues, a  gastroenterologist can be reached at any hour by calling [phone]. Do not use MyChart messaging for urgent concerns.    DIET:  We do recommend a small meal at first, but then you may proceed to your regular diet.  Drink plenty of fluids but you should avoid alcoholic beverages for 24 hours.  ACTIVITY:  You should plan to take it easy for the rest of today and you should NOT DRIVE or use heavy machinery until tomorrow (because of the sedation medicines used during the test).    FOLLOW UP: Our staff will call the number listed on your records the next business day following your procedure.  We will call around 7:15- 8:00 am to check on you and address any questions or concerns  that you may have regarding the information given to you following your procedure. If we do not reach you, we will leave a message.     If any biopsies were taken you will be contacted by phone or by letter within the next 1-3 weeks.  Please call us at [phone] if you have not heard about the biopsies in 3 weeks.    SIGNATURES/CONFIDENTIALITY: You and/or your care partner have signed paperwork which will be entered into your electronic medical record.  These signatures attest to the fact that that the information above on your After Visit Summary has been reviewed and is understood.  Full responsibility of the confidentiality of this discharge information lies with you and/or your care-partner.

## 2023-11-09 NOTE — Progress Notes (Signed)
 [facility] History and Physical   Primary Care Physician:  [facility]   Reason for Procedure:  Positive FIT test  Plan:     colonoscopy with possible interventions as needed     HPI: Jill Hanna is a very pleasant 58 y.o. female here for colonoscopy for positive FIT testy.   The risks and benefits as well as alternatives of endoscopic procedure(s) have been discussed and reviewed. All questions answered. The patient agrees to proceed.    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 Authorizing Provider  amLODipine-valsartan (EXFORGE) 10-160 MG tablet Take 1 tablet by mouth daily with breakfast.   Yes [provider]  ascorbic acid (VITAMIN C) 500 MG tablet Take 500 mg by mouth 2 (two) times daily. 09/05/15  Yes [provider]  Cholecalciferol (VITAMIN D3) 5000 units CAPS Take 1 capsule by mouth daily with breakfast.   Yes [provider]  metFORMIN (GLUCOPHAGE) 1000 MG tablet Take 1,000 mg by mouth 2 (two) times daily. 09/28/23  Yes [provider]  Multiple Vitamin (MULTIVITAMIN WITH MINERALS) TABS tablet Take 1 tablet by mouth daily.   Yes [provider]  Omega-3 Fatty Acids (FISH OIL PO) Take by mouth. Twice a week   Yes [provider]    Current Outpatient Medications  Medication Sig Dispense Refill   amLODipine-valsartan (EXFORGE) 10-160 MG tablet Take 1 tablet by mouth daily with breakfast.     ascorbic acid (VITAMIN C) 500 MG tablet Take 500 mg by mouth 2 (two) times daily.     Cholecalciferol (VITAMIN D3) 5000 units CAPS Take 1 capsule by mouth daily with  breakfast.     metFORMIN (GLUCOPHAGE) 1000 MG tablet Take 1,000 mg by mouth 2 (two) times daily.     Multiple Vitamin (MULTIVITAMIN WITH MINERALS) TABS tablet Take 1 tablet by mouth daily.     Omega-3 Fatty Acids (FISH OIL PO) Take by mouth. Twice a week     Current Facility-Administered Medications  Medication Dose Route Frequency Provider Last Rate Last Admin   0.9 %  sodium chloride infusion  500 mL Intravenous Once Napoleon Form, MD        Allergies as of 11/09/2023 - Review Complete 11/09/2023  Allergen Reaction Noted   Ivp dye [iodinated contrast media] Anaphylaxis 10/06/2023   Penicillins Hives 05/20/2013    Family History  Problem Relation Age of Onset   Diabetes Mother    Cancer Maternal Grandmother        lukemia    Social History   Socioeconomic History   Marital status: Married    Spouse name: Not on file   Number of children: 2   Years of education: Not on file   Highest education level: Not on file  Occupational History   Occupation: [employer] in [place]  Tobacco Use   Smoking status: Every Day    Current packs/day: 0.00    Types: Cigarettes    Passive exposure: Current   Smokeless tobacco: Never  Vaping Use   Vaping status: Never  Used  Substance and Sexual Activity   Alcohol use: Yes    Comment: weekends   Drug use: No   Sexual activity: Not Currently    Partners: Male    Birth control/protection: Surgical  Other Topics Concern   Not on file  Social History Narrative   Not on file   Social Drivers of Health   Financial Resource Strain: Not on file  Food Insecurity: Not on file  Transportation Needs: Not on file  Physical Activity: Not on file  Stress: Not on file  Social Connections: Not on file  Intimate Partner Violence: Not on file    Review of Systems:  All other review of systems negative except as mentioned in the HPI.  Physical Exam: Vital signs in last 24 hours: BP 124/68   Pulse 70   Temp 98 F (36.7  C)   Ht 5\' 3"  (1.6 m)   Wt 128 lb (58.1 kg)   LMP 08/19/2015   SpO2 98%   BMI 22.67 kg/m  General:   Alert, NAD Lungs:  Clear .   Heart:  Regular rate and rhythm Abdomen:  Soft, nontender and nondistended. Neuro/Psych:  Alert and cooperative. Normal mood and affect. A and O x 3  Reviewed labs, radiology imaging, old records and pertinent past GI work up  Patient is appropriate for planned procedure(s) and anesthesia in an ambulatory setting   K. Scherry Ran , MD [phone]

## 2023-11-10 ENCOUNTER — Telehealth: Payer: Self-pay

## 2023-11-10 NOTE — Telephone Encounter (Signed)
  Follow up Call-     11/09/2023    1:52 PM  Call back number  Post procedure Call Back phone  # [phone]  Permission to leave phone message Yes     Patient questions:  Do you have a fever, pain , or abdominal swelling? No. Pain Score  0 *  Have you tolerated food without any problems? Yes.    Have you been able to return to your normal activities? Yes.    Do you have any questions about your discharge instructions: Diet   No. Medications  No. Follow up visit  No.  Do you have questions or concerns about your Care? No.  Actions: * If pain score is 4 or above: No action needed, pain <4.

## 2023-11-11 NOTE — Addendum Note (Signed)
 Addended by: Mady Haagensen on: 11/11/2023 02:11 PM   Modules accepted: Orders

## 2023-11-11 NOTE — Progress Notes (Signed)
 Corrected pathology requisition order.

## 2023-11-12 LAB — SURGICAL PATHOLOGY

## 2023-11-18 ENCOUNTER — Telehealth: Payer: Self-pay | Admitting: Gastroenterology

## 2023-11-18 NOTE — Telephone Encounter (Signed)
 PT is calling to get the results of her CT as well as her colonoscopy. Please advise.

## 2023-11-18 NOTE — Telephone Encounter (Signed)
 Left message for the patient. Waiting for the doctor to review. Will call once that is done.

## 2023-12-30 ENCOUNTER — Ambulatory Visit: Payer: Self-pay | Admitting: Gastroenterology

## 2024-02-29 ENCOUNTER — Ambulatory Visit: Admitting: Gastroenterology

## 2024-02-29 ENCOUNTER — Encounter: Payer: Self-pay | Admitting: Gastroenterology

## 2024-02-29 VITALS — BP 126/74 | HR 84 | Ht 61.5 in | Wt 125.1 lb

## 2024-02-29 DIAGNOSIS — K5651 Intestinal adhesions [bands], with partial obstruction: Secondary | ICD-10-CM

## 2024-02-29 DIAGNOSIS — K565 Intestinal adhesions [bands], unspecified as to partial versus complete obstruction: Secondary | ICD-10-CM

## 2024-02-29 DIAGNOSIS — K5190 Ulcerative colitis, unspecified, without complications: Secondary | ICD-10-CM | POA: Diagnosis not present

## 2024-02-29 DIAGNOSIS — K5909 Other constipation: Secondary | ICD-10-CM

## 2024-02-29 DIAGNOSIS — R7303 Prediabetes: Secondary | ICD-10-CM | POA: Diagnosis not present

## 2024-02-29 DIAGNOSIS — K633 Ulcer of intestine: Secondary | ICD-10-CM

## 2024-02-29 DIAGNOSIS — K581 Irritable bowel syndrome with constipation: Secondary | ICD-10-CM

## 2024-02-29 MED ORDER — LINACLOTIDE 145 MCG PO CAPS
145.0000 ug | ORAL_CAPSULE | Freq: Every day | ORAL | 3 refills | Status: AC
Start: 2024-02-29 — End: ?

## 2024-02-29 NOTE — Progress Notes (Signed)
 "                Jill Hanna    [id number]    06-29-1966  Primary Care Physician:[facility]  Referring Physician: [facility] [address] [place],  [place] [address]   Chief complaint:  Constipation  Discussed the use of AI scribe software for clinical note transcription with the patient, who gave verbal consent to proceed.  History of Present Illness Jill Hanna is a 58 year old female who presents with constipation and abdominal pain following a colonoscopy.  Constipation and altered bowel habits - Constipation and abdominal pain began following a colonoscopy. - Previously had regular bowel movements; currently goes days without a bowel movement and must strain to defecate. - Uses a step stool to aid in bowel movements. - Has at least one bowel movement per week, but with significant effort. - No regular water intake; primarily consumes Dr. Nunzio and protein shakes.  Abdominal pain and bulge - Persistent abdominal bulge, more prominent when standing for long periods or in the morning upon waking. - Abdominal pain and gas present. - History of hysterectomy; attributes pain and gas to possible scar tissue from surgery. - CT scan performed; hernia suspected.  Colonoscopy findings and medication use - Colonoscopy revealed mild ulceration. - History of NSAID use (ibuprofen ); has since switched to Tylenol . - History of colitis, previously associated with diarrhea.  Weight loss - Significant weight loss following hysterectomy, from 158-168 pounds to 120 pounds.    Colonoscopy November 09, 2023 - Mucosal ulceration in the ascending colon. Biopsied. - Moderate diverticulosis in the sigmoid colon and in the descending colon. - Non- bleeding external and internal hemorrhoids. 1. Surgical [P], colon, ascending (colitis seen in exam) :       - FOCAL ACTIVE COLITIS, SEE NOTE       - NEGATIVE FOR GRANULOMAS OR DYSPLASIA        2.  Surgical [P], colon, rectum :       - COLONIC MUCOSA WITH NO SPECIFIC HISTOPATHOLOGIC CHANGES       - NEGATIVE FOR ACUTE INFLAMMATION, FEATURES OF CHRONICITY, GRANULOMAS OR       DYSPLASIA        Diagnosis Note : Definite features of chronicity are not identified but       well-healed chronic colitis cannot be ruled out.    Outpatient Encounter Medications as of 02/29/2024  Medication Sig   amLODipine-valsartan (EXFORGE) 10-160 MG tablet Take 1 tablet by mouth daily with breakfast.   ascorbic acid (VITAMIN C) 500 MG tablet Take 500 mg by mouth 2 (two) times daily.   Cholecalciferol (VITAMIN D3) 5000 units CAPS Take 1 capsule by mouth daily with breakfast.   metFORMIN (GLUCOPHAGE) 1000 MG tablet Take 1,000 mg by mouth 2 (two) times daily.   Multiple Vitamin (MULTIVITAMIN WITH MINERALS) TABS tablet Take 1 tablet by mouth daily.   Omega-3 Fatty Acids (FISH OIL PO) Take by mouth. Twice a week (Patient taking differently: Take 1 capsule by mouth as needed.)   No facility-administered encounter medications on file as of 02/29/2024.    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   Hypertension    Prediabetes  Stroke [facility])     Past Surgical History:  Procedure Laterality Date   ABDOMINAL HYSTERECTOMY     has ovaries   BLADDER REPAIR     TUBAL LIGATION      Family History  Problem Relation Age of Onset   Diabetes Mother    Cancer Maternal Grandmother        lukemia    Social History   Socioeconomic History   Marital status: Married    Spouse name: Not on file   Number of children: 2   Years of education: Not on file   Highest education level: Not on file  Occupational History   Occupation: [employer] in [place]  Tobacco Use    Smoking status: Every Day    Current packs/day: 0.00    Types: Cigarettes    Passive exposure: Current   Smokeless tobacco: Never  Vaping Use   Vaping status: Never Used  Substance and Sexual Activity   Alcohol use: Yes    Comment: weekends   Drug use: No   Sexual activity: Not Currently    Partners: Male    Birth control/protection: Surgical  Other Topics Concern   Not on file  Social History Narrative   Not on file   Social Drivers of Health   Financial Resource Strain: Not on file  Food Insecurity: Not on file  Transportation Needs: Not on file  Physical Activity: Not on file  Stress: Not on file  Social Connections: Not on file  Intimate Partner Violence: Not on file      Review of systems: All other review of systems negative except as mentioned in the HPI.   Physical Exam: Vitals:   02/29/24 0957  BP: 126/74  Pulse: 84   Body mass index is 23.26 kg/m. Gen:      No acute distress HEENT:  sclera anicteric Abd:      soft, non-tender; no palpable masses, no distension Ext:    No edema Neuro: alert and oriented x 3 Psych: normal mood and affect  Data Reviewed:  Reviewed labs, radiology imaging, old records and pertinent past GI work up     Assessment and Plan Assessment & Plan Constipation Chronic constipation with infrequent bowel movements, approximately once a week with straining. Symptoms worsened post-colonoscopy, possibly due to resolution of colitis which previously caused diarrhea. Low water intake and high protein diet exacerbate constipation. - Prescribe Linzess  145 mcg daily on an empty stomach. - Provide samples of Linzess  before filling the prescription. - Advise increasing water intake to at least 60 ounces per day. - Encourage dietary fiber intake through fruits, vegetables, and whole grains. - Suggest using a step stool or squatty potty to aid bowel movements.  Ulcerative colitis, unspecified, without complications Mild ulceration  observed during colonoscopy, likely medication-induced due to prior NSAID use. Symptoms such as diarrhea have improved, suggesting resolution.  Adhesions due to previous surgery Possible adhesions from prior hysterectomy causing intermittent abdominal pain and gas, potentially contributing to constipation and discomfort. - Advise gentle abdominal massage to alleviate discomfort.   Prediabetes Prediabetes managed with metformin. Low water intake and high Dr. Nunzio consumption may impact blood sugar control. - Encourage increased water intake to at least 60 ounces per day. - Advise reducing Dr. Nunzio consumption and increasing intake of fruits and vegetables.    The patient was provided an opportunity to ask questions and all were answered. The patient agreed with the plan and demonstrated an understanding of the instructions.  Jill Hanna , MD    CC: [facility]   "

## 2024-02-29 NOTE — Patient Instructions (Addendum)
 VISIT SUMMARY:  You visited us  today due to constipation and abdominal pain following a colonoscopy. We discussed your bowel habits, abdominal pain, and dietary habits. We also reviewed findings from your colonoscopy and addressed your concerns about a possible hernia and previous surgery-related adhesions.  YOUR PLAN:  CONSTIPATION: You have chronic constipation with infrequent bowel movements, worsened after your colonoscopy. Your low water intake and high protein diet are contributing factors. -Take Linzess  145 mcg daily on an empty stomach. We provided samples for you to try before filling the prescription. -Increase your water intake to at least 60 ounces per day. -Include more dietary fiber through fruits, vegetables, and whole grains. -Continue using a step stool or squatty potty to aid bowel movements.  ULCERATIVE COLITIS: Mild ulceration was observed during your colonoscopy, likely due to previous NSAID use. Your symptoms have improved, suggesting resolution. -We will order lab work to confirm the resolution of colitis.  ADHESIONS FROM PREVIOUS SURGERY: You may have adhesions from your prior hysterectomy causing intermittent abdominal pain and gas. -Try gentle abdominal massage to alleviate discomfort.  INGUINAL HERNIA: You have a suspected inguinal hernia with intermittent bulging and occasional pain. Surgery is not immediately necessary. -Monitor for changes in hernia size or symptoms, such as persistent protrusion or increased pain. -Seek emergency care if you experience symptoms of bowel obstruction, such as inability to pass gas or have a bowel movement.  PREDIABETES: Your prediabetes is managed with metformin, but your low water intake and high Dr. Nunzio consumption may affect blood sugar control. -Increase your water intake to at least 60 ounces per day. -Reduce your Dr. Nunzio consumption and increase your intake of fruits and vegetables.  I appreciate the  opportunity to  care for you  Thank You   Kavitha Nandigam , MD

## 2024-03-02 ENCOUNTER — Encounter: Payer: Self-pay | Admitting: Gastroenterology

## 2024-03-03 ENCOUNTER — Telehealth: Payer: Self-pay

## 2024-03-03 ENCOUNTER — Other Ambulatory Visit (HOSPITAL_COMMUNITY): Payer: Self-pay

## 2024-03-03 NOTE — Telephone Encounter (Signed)
 Pharmacy Patient Advocate Encounter   Received notification from CoverMyMeds that prior authorization for Linzess  capsules is required/requested.   Insurance verification completed.   The patient is insured through [facility] .   Per test claim: PA required; PA submitted to above mentioned insurance via CoverMyMeds Key/confirmation #/EOC [id number] Status is pending

## 2024-03-03 NOTE — Telephone Encounter (Signed)
 Pharmacy Patient Advocate Encounter  Received notification from [employer] that Prior Authorization for Linzess  capsules has been APPROVED from 03-03-2024 to 03-03-2025   PA #/Case ID/Reference #: [id number]

## 2024-03-03 NOTE — Telephone Encounter (Signed)
 Noted pt. aware

## 2024-03-08 ENCOUNTER — Other Ambulatory Visit: Payer: Self-pay | Admitting: Nurse Practitioner

## 2024-03-08 DIAGNOSIS — Z1231 Encounter for screening mammogram for malignant neoplasm of breast: Secondary | ICD-10-CM

## 2024-04-05 ENCOUNTER — Ambulatory Visit
Admission: RE | Admit: 2024-04-05 | Discharge: 2024-04-05 | Disposition: A | Discharge status: Home / Self Care | Source: Ambulatory Visit | Attending: Nurse Practitioner | Admitting: Nurse Practitioner

## 2024-04-05 DIAGNOSIS — Z1231 Encounter for screening mammogram for malignant neoplasm of breast: Secondary | ICD-10-CM
